# Patient Record
Sex: Female | Born: 1990 | Race: White | Hispanic: No | Marital: Single | Smoking: Current every day smoker
Health system: Southern US, Community
[De-identification: ages and names within clinical notes are randomized; demographics above are authoritative.]

## PROBLEM LIST (undated history)

## (undated) HISTORY — PX: APPENDECTOMY: SHX54

---
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
## 2012-03-14 ENCOUNTER — Encounter (HOSPITAL_BASED_OUTPATIENT_CLINIC_OR_DEPARTMENT_OTHER): Payer: Self-pay | Admitting: Family Medicine

## 2012-03-14 ENCOUNTER — Emergency Department (HOSPITAL_BASED_OUTPATIENT_CLINIC_OR_DEPARTMENT_OTHER)
Admission: EM | Admit: 2012-03-14 | Discharge: 2012-03-14 | Disposition: A | Discharge status: Home / Self Care | Payer: Medicaid Other | Attending: Emergency Medicine | Admitting: Emergency Medicine

## 2012-03-14 DIAGNOSIS — M436 Torticollis: Secondary | ICD-10-CM

## 2012-03-14 DIAGNOSIS — M542 Cervicalgia: Secondary | ICD-10-CM | POA: Insufficient documentation

## 2012-03-14 DIAGNOSIS — F17200 Nicotine dependence, unspecified, uncomplicated: Secondary | ICD-10-CM | POA: Insufficient documentation

## 2012-03-14 MED ORDER — IBUPROFEN 800 MG PO TABS: 800.0000 mg | ORAL_TABLET | Freq: Three times a day (TID) | ORAL | Status: AC

## 2012-03-14 MED ORDER — IBUPROFEN 800 MG PO TABS
800.0000 mg | ORAL_TABLET | Freq: Once | ORAL | Status: AC
  Administered 2012-03-14: 800 mg via ORAL
  Filled 2012-03-14: qty 1

## 2012-03-14 MED ORDER — DIAZEPAM 5 MG PO TABS: 5.0000 mg | ORAL_TABLET | Freq: Two times a day (BID) | ORAL | Status: AC

## 2012-03-14 MED ORDER — DIAZEPAM 5 MG PO TABS
5.0000 mg | ORAL_TABLET | Freq: Once | ORAL | Status: AC
  Administered 2012-03-14: 5 mg via ORAL
  Filled 2012-03-14: qty 1

## 2012-03-14 NOTE — ED Provider Notes (Signed)
History     CSN: [id number]  Arrival date & time 03/14/12  1110   First MD Initiated Contact with Patient 03/14/12 1209      Chief Complaint  Patient presents with  . Neck Pain    (Consider location/radiation/quality/duration/timing/severity/associated sxs/prior treatment) HPI Comments: Patient presents with left-sided neck pain for the past 3 days. Has been constant. It started after she woke up one morning. She denies any weakness, numbness or tingling. She's a pack without relief. She denies any fevers, vomiting, chest pain or shortness of breath. She has no weakness in the arm. She denies any injury. She has pain with moving her head to the left.  The history is provided by the patient.    History reviewed. No pertinent past medical history.  Past Surgical History  Procedure Date  . Appendectomy     No family history on file.  History  Substance Use Topics  . Smoking status: Current Everyday Smoker  . Smokeless tobacco: Not on file  . Alcohol Use: No    OB History    Grav Para Term Preterm Abortions TAB SAB Ect Mult Living                  Review of Systems  Constitutional: Negative for fever, activity change and appetite change.  HENT: Positive for neck pain. Negative for congestion, rhinorrhea and neck stiffness.   Eyes: Negative for visual disturbance.  Respiratory: Negative for cough and shortness of breath.   Cardiovascular: Negative for chest pain.  Gastrointestinal: Negative for nausea and abdominal pain.  Genitourinary: Negative for dysuria and hematuria.  Musculoskeletal: Negative for back pain.  Skin: Negative for rash.  Neurological: Negative for dizziness, weakness and headaches.    Allergies  Review of patient's allergies indicates no known allergies.  Home Medications   Current Outpatient Rx  Name Route Sig Dispense Refill  . ASPIRIN-ACETAMINOPHEN-CAFFEINE 250-250-65 MG PO TABS Oral Take 1 tablet by mouth every 6 (six) hours as needed.        BP 103/74  Pulse 99  Temp 97.8 F (36.6 C) (Oral)  Resp 18  Ht 5\' 1"  (1.549 m)  Wt 130 lb (58.968 kg)  BMI 24.56 kg/m2  SpO2 100%  Physical Exam  Constitutional: She is oriented to person, place, and time. She appears well-developed and well-nourished. No distress.  HENT:  Head: Normocephalic and atraumatic.  Mouth/Throat: Oropharynx is clear and moist. No oropharyngeal exudate.  Eyes: Conjunctivae and EOM are normal. Pupils are equal, round, and reactive to light.  Neck: Normal range of motion. Neck supple.       Left paraspinal muscle tenderness and spasm. No midline tenderness. Full range of motion of neck  Cardiovascular: Normal rate, regular rhythm and normal heart sounds.   No murmur heard. Pulmonary/Chest: Effort normal and breath sounds normal. No respiratory distress.  Abdominal: Soft. There is no tenderness. There is no rebound and no guarding.  Musculoskeletal: Normal range of motion. She exhibits no edema and no tenderness.  Neurological: She is alert and oriented to person, place, and time. No cranial nerve deficit.       Equal grip strength bilaterally, equal biceps and triceps push and pull. +2 radial pulses.   CN 2-12 intact. No facial droop  Skin: Skin is warm.    ED Course  Procedures (including critical care time)  Labs Reviewed - No data to display No results found.   No diagnosis found.    MDM  Lateral neck pain worse  with movement. No weakness, numbness or tingling. No fevers, no cranial nerve deficits. Consistent with musculoskeletal pain.  We'll treat with anti-inflammatories and muscle relaxers. Followup with PCP       Sydney Octave, MD 03/14/12 1234

## 2012-03-14 NOTE — ED Notes (Addendum)
Pt c/o left lateral neck pain x 3days without injury. Pt able to move neck in all directions with pain. Denies pain with palpation. Pt also c/o intermittent sharp pains in left ear.

## 2012-04-12 ENCOUNTER — Emergency Department (HOSPITAL_BASED_OUTPATIENT_CLINIC_OR_DEPARTMENT_OTHER)
Admission: EM | Admit: 2012-04-12 | Discharge: 2012-04-12 | Disposition: A | Discharge status: Home / Self Care | Payer: Self-pay | Attending: Emergency Medicine | Admitting: Emergency Medicine

## 2012-04-12 ENCOUNTER — Encounter (HOSPITAL_BASED_OUTPATIENT_CLINIC_OR_DEPARTMENT_OTHER): Payer: Self-pay | Admitting: *Deleted

## 2012-04-12 DIAGNOSIS — L03319 Cellulitis of trunk, unspecified: Secondary | ICD-10-CM | POA: Insufficient documentation

## 2012-04-12 DIAGNOSIS — L0291 Cutaneous abscess, unspecified: Secondary | ICD-10-CM

## 2012-04-12 DIAGNOSIS — L02219 Cutaneous abscess of trunk, unspecified: Secondary | ICD-10-CM | POA: Insufficient documentation

## 2012-04-12 DIAGNOSIS — F17200 Nicotine dependence, unspecified, uncomplicated: Secondary | ICD-10-CM | POA: Insufficient documentation

## 2012-04-12 MED ORDER — LIDOCAINE-EPINEPHRINE 2 %-1:100000 IJ SOLN
INTRAMUSCULAR | Status: AC
  Administered 2012-04-12: 1 mL
  Filled 2012-04-12: qty 1

## 2012-04-12 MED ORDER — ACETAMINOPHEN-CODEINE #3 300-30 MG PO TABS: 1.0000 | ORAL_TABLET | Freq: Once | ORAL | Status: DC

## 2012-04-12 MED ORDER — ACETAMINOPHEN-CODEINE #3 300-30 MG PO TABS: 1.0000 | ORAL_TABLET | Freq: Four times a day (QID) | ORAL | Status: DC | PRN

## 2012-04-12 NOTE — ED Provider Notes (Signed)
History     CSN: [id number]  Arrival date & time 04/12/12  1647   First MD Initiated Contact with Patient 04/12/12 1700      Chief Complaint  Patient presents with  . Abscess    (Consider location/radiation/quality/duration/timing/severity/associated sxs/prior treatment) HPI Comments: 21 year old female presents the emergency department complaining of an abscess in the left side of her back that she noticed 3 days ago. She states that the abscess is gotten larger over the past 3 days, and when she applied a warm compress to it after a warm shower today she was able to express some pus from it. Denies fever, chills or nausea. She does not recall being bit by anything. Denies any rashes on her body. The abscess is painful to touch.  Patient is a 21 y.o. female presenting with abscess. The history is provided by the patient and a parent.  Abscess  Pertinent negatives include no fever.    History reviewed. No pertinent past medical history.  Past Surgical History  Procedure Date  . Appendectomy     History reviewed. No pertinent family history.  History  Substance Use Topics  . Smoking status: Current Every Day Smoker -- 0.5 packs/day    Types: Cigarettes  . Smokeless tobacco: Not on file  . Alcohol Use: No    OB History    Grav Para Term Preterm Abortions TAB SAB Ect Mult Living                  Review of Systems  Constitutional: Negative for fever and chills.  Respiratory: Negative for shortness of breath.   Cardiovascular: Negative for chest pain.  Gastrointestinal: Negative for nausea.  Musculoskeletal: Negative for arthralgias.  Skin:       Positive for abscess    Allergies  Review of patient's allergies indicates no known allergies.  Home Medications   Current Outpatient Rx  Name Route Sig Dispense Refill  . ASPIRIN-ACETAMINOPHEN-CAFFEINE 250-250-65 MG PO TABS Oral Take 1 tablet by mouth every 6 (six) hours as needed.      BP 121/52  Temp 98.5 F  (36.9 C) (Oral)  Resp 16  Ht 5\' 4"  (1.626 m)  Wt 130 lb (58.968 kg)  BMI 22.31 kg/m2  SpO2 100%  Physical Exam  Nursing note and vitals reviewed. Constitutional: She is oriented to person, place, and time. She appears well-developed and well-nourished. No distress.  HENT:  Head: Normocephalic and atraumatic.  Eyes: Conjunctivae normal are normal.  Neck: Normal range of motion. Neck supple.  Cardiovascular: Normal rate, regular rhythm and normal heart sounds.   Pulmonary/Chest: Effort normal and breath sounds normal.  Musculoskeletal: Normal range of motion.  Lymphadenopathy:    She has no cervical adenopathy.  Neurological: She is alert and oriented to person, place, and time.  Skin: Skin is warm and dry. No rash noted.       1 in diameter erythematous abscess with induration present on left side of back about mid-way. Tender to palpation. No active drainage. No surrounding erythema or edema.  Psychiatric: She has a normal mood and affect. Her behavior is normal.    ED Course  Procedures (including critical care time) INCISION AND DRAINAGE Performed by: Johnnette Gourd Consent: Verbal consent obtained. Risks and benefits: risks, benefits and alternatives were discussed Type: abscess  Body area: left side of back  Anesthesia: local infiltration  Local anesthetic: lidocaine 2% with epinephrine  Anesthetic total: 4 ml  Complexity: complex Blunt dissection to break up loculations  Drainage: purulent  Drainage amount: small  Packing material: 1/4 in iodoform gauze  Patient tolerance: Patient tolerated the procedure well with no immediate complications.    Labs Reviewed - No data to display No results found.   1. Abscess       MDM  21 y/o female with abscess without cellulitis. Abscess drained and packed without any problem. Will give pain medicine. No abx needed. Aware to return in 48 hours for recheck/packing removal.        Trevor Mace,  PA-C 04/12/12 1801

## 2012-04-12 NOTE — ED Notes (Signed)
Pt c/o abscess to lower back x 3 days

## 2012-04-13 NOTE — ED Provider Notes (Signed)
Medical screening examination/treatment/procedure(s) were performed by non-physician practitioner and as supervising physician I was immediately available for consultation/collaboration.  Geoffery Lyons, MD 04/13/12 1944

## 2012-04-14 ENCOUNTER — Emergency Department (HOSPITAL_BASED_OUTPATIENT_CLINIC_OR_DEPARTMENT_OTHER)
Admission: EM | Admit: 2012-04-14 | Discharge: 2012-04-14 | Disposition: A | Discharge status: Home / Self Care | Payer: Self-pay | Attending: Emergency Medicine | Admitting: Emergency Medicine

## 2012-04-14 ENCOUNTER — Encounter (HOSPITAL_BASED_OUTPATIENT_CLINIC_OR_DEPARTMENT_OTHER): Payer: Self-pay | Admitting: Emergency Medicine

## 2012-04-14 DIAGNOSIS — L0390 Cellulitis, unspecified: Secondary | ICD-10-CM | POA: Insufficient documentation

## 2012-04-14 DIAGNOSIS — L0291 Cutaneous abscess, unspecified: Secondary | ICD-10-CM | POA: Insufficient documentation

## 2012-04-14 DIAGNOSIS — Z4800 Encounter for change or removal of nonsurgical wound dressing: Secondary | ICD-10-CM | POA: Insufficient documentation

## 2012-04-14 DIAGNOSIS — F17200 Nicotine dependence, unspecified, uncomplicated: Secondary | ICD-10-CM | POA: Insufficient documentation

## 2012-04-14 NOTE — ED Notes (Signed)
Recheck abscess

## 2012-04-15 NOTE — ED Provider Notes (Signed)
History     CSN: [id number]  Arrival date & time 04/14/12  1928   First MD Initiated Contact with Patient 04/14/12 2144      Chief Complaint  Patient presents with  . Wound Check    (Consider location/radiation/quality/duration/timing/severity/associated sxs/prior treatment) Patient is a 21 y.o. female presenting with wound check. The history is provided by the patient. No language interpreter was used.  Wound Check  She was treated in the ED 2 to 3 days ago.  Pt here for recheck of i and d and packing removal.  Pt denies any complaints  History reviewed. No pertinent past medical history.  Past Surgical History  Procedure Date  . Appendectomy     No family history on file.  History  Substance Use Topics  . Smoking status: Current Every Day Smoker -- 0.5 packs/day    Types: Cigarettes  . Smokeless tobacco: Not on file  . Alcohol Use: No    OB History    Grav Para Term Preterm Abortions TAB SAB Ect Mult Living                  Review of Systems  Skin: Positive for wound.  All other systems reviewed and are negative.    Allergies  Review of patient's allergies indicates no known allergies.  Home Medications   Current Outpatient Rx  Name Route Sig Dispense Refill  . ACETAMINOPHEN-CODEINE #3 300-30 MG PO TABS Oral Take 1-2 tablets by mouth every 6 (six) hours as needed for pain. 6 tablet 0  . ASPIRIN-ACETAMINOPHEN-CAFFEINE 250-250-65 MG PO TABS Oral Take 1 tablet by mouth every 6 (six) hours as needed.      There were no vitals taken for this visit.  Physical Exam  Nursing note and vitals reviewed. Constitutional: She is oriented to person, place, and time. She appears well-developed and well-nourished.  Musculoskeletal: She exhibits tenderness.  Neurological: She is alert and oriented to person, place, and time. She has normal reflexes.  Skin: There is erythema.       1cm incision,  Packing removed.  No active drainage  Psychiatric: She has a normal  mood and affect.    ED Course  Procedures (including critical care time)  Labs Reviewed - No data to display No results found.   1. Abscess       MDM  Pt counseled on wound care.   I advised pt to return if any problems.        Lonia Skinner [place], [place] 04/15/12 2333

## 2012-04-16 NOTE — ED Provider Notes (Signed)
Medical screening examination/treatment/procedure(s) were performed by non-physician practitioner and as supervising physician I was immediately available for consultation/collaboration.   Dione Booze, MD 04/16/12 [phone]

## 2012-10-25 ENCOUNTER — Encounter (HOSPITAL_BASED_OUTPATIENT_CLINIC_OR_DEPARTMENT_OTHER): Payer: Self-pay | Admitting: Emergency Medicine

## 2012-10-25 ENCOUNTER — Emergency Department (HOSPITAL_BASED_OUTPATIENT_CLINIC_OR_DEPARTMENT_OTHER)
Admission: EM | Admit: 2012-10-25 | Discharge: 2012-10-25 | Disposition: A | Discharge status: Home / Self Care | Payer: Self-pay | Attending: Emergency Medicine | Admitting: Emergency Medicine

## 2012-10-25 DIAGNOSIS — A5901 Trichomonal vulvovaginitis: Secondary | ICD-10-CM | POA: Insufficient documentation

## 2012-10-25 DIAGNOSIS — F17200 Nicotine dependence, unspecified, uncomplicated: Secondary | ICD-10-CM | POA: Insufficient documentation

## 2012-10-25 DIAGNOSIS — A599 Trichomoniasis, unspecified: Secondary | ICD-10-CM

## 2012-10-25 DIAGNOSIS — Z3202 Encounter for pregnancy test, result negative: Secondary | ICD-10-CM | POA: Insufficient documentation

## 2012-10-25 LAB — PREGNANCY, URINE: Preg Test, Ur: NEGATIVE

## 2012-10-25 LAB — WET PREP, GENITAL

## 2012-10-25 MED ORDER — LIDOCAINE HCL (PF) 1 % IJ SOLN
INTRAMUSCULAR | Status: AC
  Administered 2012-10-25: 5 mL
  Filled 2012-10-25: qty 5

## 2012-10-25 MED ORDER — AZITHROMYCIN 1 G PO PACK
1.0000 g | PACK | Freq: Once | ORAL | Status: AC
  Administered 2012-10-25: 1 g via ORAL
  Filled 2012-10-25: qty 1

## 2012-10-25 MED ORDER — CEFTRIAXONE SODIUM 250 MG IJ SOLR
250.0000 mg | Freq: Once | INTRAMUSCULAR | Status: AC
  Administered 2012-10-25: 250 mg via INTRAMUSCULAR
  Filled 2012-10-25: qty 250

## 2012-10-25 MED ORDER — METRONIDAZOLE 500 MG PO TABS
2000.0000 mg | ORAL_TABLET | Freq: Once | ORAL | Status: AC
  Administered 2012-10-25: 2000 mg via ORAL
  Filled 2012-10-25: qty 4

## 2012-10-25 NOTE — ED Provider Notes (Signed)
History     CSN: [id number]  Arrival date & time 10/25/12  0057   First MD Initiated Contact with Patient 10/25/12 0125      Chief Complaint  Patient presents with  . Vaginal Discharge  . Abscess    (Consider location/radiation/quality/duration/timing/severity/associated sxs/prior treatment) Patient is a 22 y.o. female presenting with vaginal discharge. The history is provided by the patient. No language interpreter was used.  Vaginal Discharge This is a recurrent problem. The current episode started more than 1 week ago. The problem occurs constantly. The problem has not changed since onset.Pertinent negatives include no chest pain, no abdominal pain and no shortness of breath. Nothing aggravates the symptoms. Nothing relieves the symptoms. She has tried nothing for the symptoms. The treatment provided no relief.  Also has raised lesion on mons pubis for 2 months  History reviewed. No pertinent past medical history.  Past Surgical History  Procedure Laterality Date  . Appendectomy      No family history on file.  History  Substance Use Topics  . Smoking status: Current Every Day Smoker -- 0.50 packs/day    Types: Cigarettes  . Smokeless tobacco: Not on file  . Alcohol Use: No    OB History   Grav Para Term Preterm Abortions TAB SAB Ect Mult Living                  Review of Systems  Respiratory: Negative for shortness of breath.   Cardiovascular: Negative for chest pain.  Gastrointestinal: Negative for abdominal pain.  Genitourinary: Positive for vaginal discharge.  All other systems reviewed and are negative.    Allergies  Review of patient's allergies indicates no known allergies.  Home Medications   Current Outpatient Rx  Name  Route  Sig  Dispense  Refill  . acetaminophen-codeine (TYLENOL #3) 300-30 MG per tablet   Oral   Take 1-2 tablets by mouth every 6 (six) hours as needed for pain.   6 tablet   0   . aspirin-acetaminophen-caffeine (EXCEDRIN  MIGRAINE) 250-250-65 MG per tablet   Oral   Take 1 tablet by mouth every 6 (six) hours as needed.           BP 137/58  Pulse 109  Temp(Src) 98.5 F (36.9 C) (Oral)  Resp 16  Ht 5\' 4"  (1.626 m)  Wt 123 lb (55.792 kg)  BMI 21.1 kg/m2  SpO2 100%  LMP 10/03/2012  Physical Exam  Constitutional: She is oriented to person, place, and time. She appears well-developed and well-nourished. No distress.  HENT:  Head: Normocephalic and atraumatic.  Mouth/Throat: Oropharynx is clear and moist.  Eyes: Conjunctivae are normal. Pupils are equal, round, and reactive to light.  Neck: Normal range of motion. Neck supple.  Cardiovascular: Normal rate, regular rhythm and intact distal pulses.   Pulmonary/Chest: Effort normal and breath sounds normal. She has no wheezes. She has no rales.  Abdominal: Soft. Bowel sounds are normal. There is no tenderness. There is no rebound and no guarding.  Genitourinary: Vaginal discharge found.  Ingrown hair with abrasion and contused skin consistent with shaving.  No warmth no erythema no fluctuance.  Ingrown hair abutts a condyloma.  No indication for I/D  Chaperone present discharge is frothy no cmt no adnexal tenderness  Musculoskeletal: Normal range of motion.  Neurological: She is alert and oriented to person, place, and time.  Skin: Skin is warm and dry.  Psychiatric: She has a normal mood and affect.    ED  Course  Procedures (including critical care time)  Labs Reviewed  WET PREP, GENITAL  GC/CHLAMYDIA PROBE AMP  PREGNANCY, URINE   No results found.   No diagnosis found.    MDM  Exam and wet prep consistent with trichomonas will treat for all STI.  No sexaul activity of any kind until 7 days after all partners treated [facility] for recheck in 7 days.  Patient verbalizes understanding and agrees to follow up.  Neosporin on abraded hair/condyloma.  No shaving x 2 weeks        Charls Custer K Arinze Rivadeneira-Rasch,  MD 10/25/12 [phone]

## 2012-10-25 NOTE — ED Notes (Signed)
Abscess in "bikini line" x2 months.  Sts she can usually get them to "come to a head" herself but this one is getting worse. Also has heavy white vaginal DC x2 weeks.  Was seen at the health dept in [facility] and was told they would call if any tests came back pos.  No one called but the DC continues.  Sts she has called them multiple times but has not been able to get any results.

## 2012-12-06 ENCOUNTER — Emergency Department (HOSPITAL_BASED_OUTPATIENT_CLINIC_OR_DEPARTMENT_OTHER)
Admission: EM | Admit: 2012-12-06 | Discharge: 2012-12-06 | Disposition: A | Discharge status: Home / Self Care | Payer: Self-pay | Attending: Emergency Medicine | Admitting: Emergency Medicine

## 2012-12-06 ENCOUNTER — Emergency Department (HOSPITAL_BASED_OUTPATIENT_CLINIC_OR_DEPARTMENT_OTHER): Payer: Self-pay

## 2012-12-06 ENCOUNTER — Encounter (HOSPITAL_BASED_OUTPATIENT_CLINIC_OR_DEPARTMENT_OTHER): Payer: Self-pay | Admitting: Family Medicine

## 2012-12-06 DIAGNOSIS — B9689 Other specified bacterial agents as the cause of diseases classified elsewhere: Secondary | ICD-10-CM

## 2012-12-06 DIAGNOSIS — R109 Unspecified abdominal pain: Secondary | ICD-10-CM | POA: Insufficient documentation

## 2012-12-06 DIAGNOSIS — Z9089 Acquired absence of other organs: Secondary | ICD-10-CM | POA: Insufficient documentation

## 2012-12-06 DIAGNOSIS — Z3201 Encounter for pregnancy test, result positive: Secondary | ICD-10-CM | POA: Insufficient documentation

## 2012-12-06 DIAGNOSIS — N760 Acute vaginitis: Secondary | ICD-10-CM | POA: Insufficient documentation

## 2012-12-06 DIAGNOSIS — Z3490 Encounter for supervision of normal pregnancy, unspecified, unspecified trimester: Secondary | ICD-10-CM

## 2012-12-06 DIAGNOSIS — F17200 Nicotine dependence, unspecified, uncomplicated: Secondary | ICD-10-CM | POA: Insufficient documentation

## 2012-12-06 LAB — BASIC METABOLIC PANEL
Calcium: 9.5 mg/dL (ref 8.4–10.5)
Creatinine, Ser: 0.7 mg/dL (ref 0.50–1.10)
GFR calc Af Amer: >90 mL/min (ref >90)

## 2012-12-06 LAB — URINALYSIS, ROUTINE W REFLEX MICROSCOPIC
Glucose, UA: NEGATIVE mg/dL
Ketones, ur: NEGATIVE mg/dL
Leukocytes, UA: NEGATIVE
pH: 6 (ref 5.0–8.0)

## 2012-12-06 LAB — CBC WITH DIFFERENTIAL/PLATELET
Basophils Absolute: 0 10*3/uL (ref 0.0–0.1)
Basophils Relative: 0 % (ref 0–1)
Eosinophils Relative: 2 % (ref 0–5)
HCT: 38.6 % (ref 36.0–46.0)
MCH: 33.1 pg (ref 26.0–34.0)
MCHC: 34.7 g/dL (ref 30.0–36.0)
MCV: 95.3 fL (ref 78.0–100.0)
Monocytes Absolute: 0.6 10*3/uL (ref 0.1–1.0)
RDW: 13.1 % (ref 11.5–15.5)

## 2012-12-06 LAB — PREGNANCY, URINE: Preg Test, Ur: POSITIVE — AB

## 2012-12-06 LAB — WET PREP, GENITAL: Trich, Wet Prep: NONE SEEN

## 2012-12-06 LAB — HCG, QUANTITATIVE, PREGNANCY: hCG, Beta Chain, Quant, S: 772 m[IU]/mL — ABNORMAL HIGH (ref <5)

## 2012-12-06 MED ORDER — PRENATAL COMPLETE 14-0.4 MG PO TABS: 1.0000 | ORAL_TABLET | Freq: Every day | ORAL | Status: DC

## 2012-12-06 MED ORDER — METRONIDAZOLE 500 MG PO TABS: 500.0000 mg | ORAL_TABLET | Freq: Two times a day (BID) | ORAL | Status: DC

## 2012-12-06 NOTE — ED Provider Notes (Signed)
History     CSN: [id number]  Arrival date & time 12/06/12  1725   First MD Initiated Contact with Patient 12/06/12 1759      Chief Complaint  Patient presents with  . Vaginal Discharge  . Possible Pregnancy    (Consider location/radiation/quality/duration/timing/severity/associated sxs/prior treatment) Patient is a 22 y.o. female presenting with vaginal discharge and pregnancy problem.  Vaginal Discharge Possible Pregnancy  Primary symptoms include vaginal discharge.   Pt reports she was treated for trichomonas about 6 weeks ago, reports she abstained from sex until about 2 weeks ago when she had sex using a condom, however the condom broke. She has had discharge again since then, white thick with odor. She also reports she had a severe, sharp suprapubic pain last night which lasted for several minutes and then resolved. No current pain. No vaginal bleeding. She was previous on Depo-Provera and so is unsure what her menstrual cycle regularity is or when she last had one. No previous pregnancies.   History reviewed. No pertinent past medical history.  Past Surgical History  Procedure Laterality Date  . Appendectomy      No family history on file.  History  Substance Use Topics  . Smoking status: Current Every Day Smoker -- 0.50 packs/day    Types: Cigarettes  . Smokeless tobacco: Not on file  . Alcohol Use: Yes    OB History   Grav Para Term Preterm Abortions TAB SAB Ect Mult Living                  Review of Systems  Genitourinary: Positive for vaginal discharge.   All other systems reviewed and are negative except as noted in HPI.   Allergies  Review of patient's allergies indicates no known allergies.  Home Medications   Current Outpatient Rx  Name  Route  Sig  Dispense  Refill  . acetaminophen-codeine (TYLENOL #3) 300-30 MG per tablet   Oral   Take 1-2 tablets by mouth every 6 (six) hours as needed for pain.   6 tablet   0   .  aspirin-acetaminophen-caffeine (EXCEDRIN MIGRAINE) 250-250-65 MG per tablet   Oral   Take 1 tablet by mouth every 6 (six) hours as needed.           BP 106/85  Pulse 108  Temp(Src) 99.2 F (37.3 C) (Oral)  Resp 16  Ht 5\' 4"  (1.626 m)  Wt 130 lb (58.968 kg)  BMI 22.3 kg/m2  SpO2 99%  Physical Exam  Nursing note and vitals reviewed. Constitutional: She is oriented to person, place, and time. She appears well-developed and well-nourished.  HENT:  Head: Normocephalic and atraumatic.  Eyes: EOM are normal. Pupils are equal, round, and reactive to light.  Neck: Normal range of motion. Neck supple.  Cardiovascular: Normal rate, normal heart sounds and intact distal pulses.   Pulmonary/Chest: Effort normal and breath sounds normal.  Abdominal: Bowel sounds are normal. She exhibits no distension. There is no tenderness.  Genitourinary: Uterus is not tender. Cervix exhibits discharge. Cervix exhibits no motion tenderness and no friability. Right adnexum displays no mass and no tenderness. Left adnexum displays tenderness. Left adnexum displays no mass. No bleeding around the vagina. No foreign body around the vagina. Vaginal discharge found.  Musculoskeletal: Normal range of motion. She exhibits no edema and no tenderness.  Neurological: She is alert and oriented to person, place, and time. She has normal strength. No cranial nerve deficit or sensory deficit.  Skin: Skin is warm  and dry. No rash noted.  Psychiatric: She has a normal mood and affect.    ED Course  Procedures (including critical care time)  Labs Reviewed  WET PREP, GENITAL - Abnormal; Notable for the following:    Clue Cells Wet Prep HPF POC MANY (*)    WBC, Wet Prep HPF POC FEW (*)    All other components within normal limits  PREGNANCY, URINE - Abnormal; Notable for the following:    Preg Test, Ur POSITIVE (*)    All other components within normal limits  BASIC METABOLIC PANEL - Abnormal; Notable for the  following:    Glucose, Bld 123 (*)    All other components within normal limits  HCG, QUANTITATIVE, PREGNANCY - Abnormal; Notable for the following:    hCG, Beta Chain, Quant, S 772 (*)    All other components within normal limits  GC/CHLAMYDIA PROBE AMP  URINALYSIS, ROUTINE W REFLEX MICROSCOPIC  CBC WITH DIFFERENTIAL   US Ob Comp Less 14 Wks  12/06/2012   *RADIOLOGY REPORT*  Clinical Data: Pelvic pain.  Early pregnancy.  OBSTETRIC <14 WK [place] AND TRANSVAGINAL OB US  Technique: Both transabdominal and transvaginal ultrasound examinations were performed for complete evaluation of the gestation as well as the maternal uterus, adnexal regions, and pelvic cul-de-sac.  Findings: Single intrauterine gestational sac observed with yolk sac and embryo.  Embryonic cardiac activity is visible 155 beats per minute.  Crown-rump length 2.3 mm compatible with 5 weeks 5 days gestation.  No subchorionic hemorrhage observed.  Maternal right ovary measures 3.2 x 1.8 x 2.1 cm.  The left ovary measures 2.3 x 1.5 x 1.8 cm  No free pelvic fluid.  IMPRESSION:  1.  Single intrauterine pregnancy measuring at 5 weeks 5 days gestation, with cardiac activity 155 beats per minute.  No free fluid or adnexal mass observed.   Original Report Authenticated By: Gaylyn Rong, M.D.   US Ob Transvaginal  12/06/2012   *RADIOLOGY REPORT*  Clinical Data: Pelvic pain.  Early pregnancy.  OBSTETRIC <14 WK [place] AND TRANSVAGINAL OB US  Technique: Both transabdominal and transvaginal ultrasound examinations were performed for complete evaluation of the gestation as well as the maternal uterus, adnexal regions, and pelvic cul-de-sac.  Findings: Single intrauterine gestational sac observed with yolk sac and embryo.  Embryonic cardiac activity is visible 155 beats per minute.  Crown-rump length 2.3 mm compatible with 5 weeks 5 days gestation.  No subchorionic hemorrhage observed.  Maternal right ovary measures 3.2 x 1.8 x 2.1 cm.  The left ovary  measures 2.3 x 1.5 x 1.8 cm  No free pelvic fluid.  IMPRESSION:  1.  Single intrauterine pregnancy measuring at 5 weeks 5 days gestation, with cardiac activity 155 beats per minute.  No free fluid or adnexal mass observed.   Original Report Authenticated By: Gaylyn Rong, M.D.     1. Normal IUP (intrauterine pregnancy) on prenatal ultrasound   2. BV (bacterial vaginosis)       MDM  Wet prep shows BV, trich has resolved. [place] neg for ectopic, viable IUP seen. Advised Ob followup. Prenatal vitamins. Return for any other concerns.         Anees Vanecek B. Bernette Mayers, MD 12/06/12 2018

## 2012-12-06 NOTE — ED Notes (Signed)
MD at bedside. 

## 2012-12-06 NOTE — ED Notes (Signed)
Pt sts she would like a pregnancy test. Pt sts she had one episode of lower abd pain last night. Pt also reports recently treated for std and vag discharge has returned.

## 2012-12-10 ENCOUNTER — Encounter (HOSPITAL_BASED_OUTPATIENT_CLINIC_OR_DEPARTMENT_OTHER): Payer: Self-pay | Admitting: Student

## 2012-12-10 ENCOUNTER — Emergency Department (HOSPITAL_BASED_OUTPATIENT_CLINIC_OR_DEPARTMENT_OTHER)
Admission: EM | Admit: 2012-12-10 | Discharge: 2012-12-10 | Disposition: A | Discharge status: Home / Self Care | Payer: Self-pay | Attending: Emergency Medicine | Admitting: Emergency Medicine

## 2012-12-10 DIAGNOSIS — F17200 Nicotine dependence, unspecified, uncomplicated: Secondary | ICD-10-CM | POA: Insufficient documentation

## 2012-12-10 DIAGNOSIS — Z79899 Other long term (current) drug therapy: Secondary | ICD-10-CM | POA: Insufficient documentation

## 2012-12-10 DIAGNOSIS — O039 Complete or unspecified spontaneous abortion without complication: Secondary | ICD-10-CM | POA: Insufficient documentation

## 2012-12-10 NOTE — ED Notes (Signed)
Pt in with continued abdominal pain and reports passing large clots since last night. Reports being [redacted] week pregnant

## 2012-12-10 NOTE — ED Notes (Signed)
Pelvic cart at bedside, supplies gathered for pelvic exam.

## 2012-12-10 NOTE — ED Provider Notes (Signed)
History     CSN: [id number]  Arrival date & time 12/10/12  1353   First MD Initiated Contact with Patient 12/10/12 1357      Chief Complaint  Patient presents with  . Vaginal Bleeding    large clots since last night, [redacted] weeks pregnant    (Consider location/radiation/quality/duration/timing/severity/associated sxs/prior treatment) Patient is a 22 y.o. female presenting with vaginal bleeding.  Vaginal Bleeding  Pt known to me from recent visit for pelvic pain and vaginal discharge found to be approx [redacted]wks pregnant then with BV reports light vaginal bleeding onset 2 days ago and passing a large clot last night. She has had some mild cramping.    History reviewed. No pertinent past medical history.  Past Surgical History  Procedure Laterality Date  . Appendectomy      History reviewed. No pertinent family history.  History  Substance Use Topics  . Smoking status: Current Every Day Smoker -- 0.50 packs/day    Types: Cigarettes  . Smokeless tobacco: Not on file  . Alcohol Use: Yes    OB History   Grav Para Term Preterm Abortions TAB SAB Ect Mult Living                  Review of Systems  Genitourinary: Positive for vaginal bleeding.   All other systems reviewed and are negative except as noted in HPI.   Allergies  Review of patient's allergies indicates no known allergies.  Home Medications   Current Outpatient Rx  Name  Route  Sig  Dispense  Refill  . acetaminophen-codeine (TYLENOL #3) 300-30 MG per tablet   Oral   Take 1-2 tablets by mouth every 6 (six) hours as needed for pain.   6 tablet   0   . aspirin-acetaminophen-caffeine (EXCEDRIN MIGRAINE) 250-250-65 MG per tablet   Oral   Take 1 tablet by mouth every 6 (six) hours as needed.         . metroNIDAZOLE (FLAGYL) 500 MG tablet   Oral   Take 1 tablet (500 mg total) by mouth 2 (two) times daily.   14 tablet   0   . Prenatal Vit-Fe Fumarate-FA (PRENATAL COMPLETE) 14-0.4 MG TABS   Oral   Take 1  tablet by mouth daily.   30 each   0     BP 124/85  Pulse 102  Temp(Src) 98.8 F (37.1 C) (Oral)  Resp 16  Wt 130 lb (58.968 kg)  BMI 22.3 kg/m2  SpO2 100%  LMP 11/04/2012  Physical Exam  Nursing note and vitals reviewed. Constitutional: She is oriented to person, place, and time. She appears well-developed and well-nourished.  HENT:  Head: Normocephalic and atraumatic.  Eyes: EOM are normal. Pupils are equal, round, and reactive to light.  Neck: Normal range of motion. Neck supple.  Cardiovascular: Normal rate, normal heart sounds and intact distal pulses.   Pulmonary/Chest: Effort normal and breath sounds normal.  Abdominal: Bowel sounds are normal. She exhibits no distension. There is no tenderness.  Genitourinary:  Blood in vagina, no POC at cervical os  Musculoskeletal: Normal range of motion. She exhibits no edema and no tenderness.  Neurological: She is alert and oriented to person, place, and time. She has normal strength. No cranial nerve deficit or sensory deficit.  Skin: Skin is warm and dry. No rash noted.  Psychiatric: She has a normal mood and affect.    ED Course  Procedures (including critical care time)  Labs Reviewed - No data to  display No results found.   1. Spontaneous miscarriage       MDM  Pt with likely spontaneous miscarriage. No POC seen. Advised Gyn followup if bleeding is persistent or pain increases.         Dyanna Seiter B. Bernette Mayers, MD 12/10/12 1443

## 2013-02-03 ENCOUNTER — Emergency Department (HOSPITAL_BASED_OUTPATIENT_CLINIC_OR_DEPARTMENT_OTHER)
Admission: EM | Admit: 2013-02-03 | Discharge: 2013-02-04 | Disposition: A | Discharge status: Home / Self Care | Payer: Self-pay | Attending: Emergency Medicine | Admitting: Emergency Medicine

## 2013-02-03 ENCOUNTER — Encounter (HOSPITAL_BASED_OUTPATIENT_CLINIC_OR_DEPARTMENT_OTHER): Payer: Self-pay | Admitting: Emergency Medicine

## 2013-02-03 ENCOUNTER — Emergency Department (HOSPITAL_BASED_OUTPATIENT_CLINIC_OR_DEPARTMENT_OTHER): Payer: Self-pay

## 2013-02-03 DIAGNOSIS — Z3202 Encounter for pregnancy test, result negative: Secondary | ICD-10-CM | POA: Insufficient documentation

## 2013-02-03 DIAGNOSIS — R51 Headache: Secondary | ICD-10-CM | POA: Insufficient documentation

## 2013-02-03 DIAGNOSIS — F17200 Nicotine dependence, unspecified, uncomplicated: Secondary | ICD-10-CM | POA: Insufficient documentation

## 2013-02-03 DIAGNOSIS — Z79899 Other long term (current) drug therapy: Secondary | ICD-10-CM | POA: Insufficient documentation

## 2013-02-03 DIAGNOSIS — L03211 Cellulitis of face: Secondary | ICD-10-CM | POA: Insufficient documentation

## 2013-02-03 DIAGNOSIS — L0201 Cutaneous abscess of face: Secondary | ICD-10-CM | POA: Insufficient documentation

## 2013-02-03 DIAGNOSIS — R21 Rash and other nonspecific skin eruption: Secondary | ICD-10-CM | POA: Insufficient documentation

## 2013-02-03 LAB — CBC WITH DIFFERENTIAL/PLATELET
Basophils Absolute: 0 10*3/uL (ref 0.0–0.1)
Eosinophils Absolute: 0.1 10*3/uL (ref 0.0–0.7)
HCT: 43.2 % (ref 36.0–46.0)
Lymphocytes Relative: 25 % (ref 12–46)
MCH: 33 pg (ref 26.0–34.0)
MCHC: 34 g/dL (ref 30.0–36.0)
Monocytes Absolute: 0.6 10*3/uL (ref 0.1–1.0)
Neutro Abs: 4.5 10*3/uL (ref 1.7–7.7)
RDW: 12.9 % (ref 11.5–15.5)

## 2013-02-03 LAB — BASIC METABOLIC PANEL
BUN: 12 mg/dL (ref 6–23)
Calcium: 9.9 mg/dL (ref 8.4–10.5)
Creatinine, Ser: 0.8 mg/dL (ref 0.50–1.10)
GFR calc Af Amer: >90 mL/min (ref >90)
GFR calc non Af Amer: >90 mL/min (ref >90)
Potassium: 3.5 mEq/L (ref 3.5–5.1)

## 2013-02-03 MED ORDER — MORPHINE SULFATE 4 MG/ML IJ SOLN
4.0000 mg | Freq: Once | INTRAMUSCULAR | Status: AC
  Administered 2013-02-03: 4 mg via INTRAVENOUS
  Filled 2013-02-03: qty 1

## 2013-02-03 MED ORDER — CLINDAMYCIN PHOSPHATE 600 MG/50ML IV SOLN
INTRAVENOUS | Status: AC
  Administered 2013-02-03: 600 mg via INTRAVENOUS
  Filled 2013-02-03: qty 50

## 2013-02-03 MED ORDER — SODIUM CHLORIDE 0.9 % IV SOLN
Freq: Once | INTRAVENOUS | Status: AC
  Administered 2013-02-03: 22:00:00 via INTRAVENOUS

## 2013-02-03 MED ORDER — CLINDAMYCIN PHOSPHATE 600 MG/50ML IV SOLN
600.0000 mg | Freq: Once | INTRAVENOUS | Status: AC
  Administered 2013-02-03: 600 mg via INTRAVENOUS

## 2013-02-03 MED ORDER — CLINDAMYCIN HCL 150 MG PO CAPS: 150.0000 mg | ORAL_CAPSULE | Freq: Four times a day (QID) | ORAL | Status: DC

## 2013-02-03 MED ORDER — IOHEXOL 300 MG/ML  SOLN
75.0000 mL | Freq: Once | INTRAMUSCULAR | Status: AC | PRN
  Administered 2013-02-03: 75 mL via INTRAVENOUS

## 2013-02-03 MED ORDER — MORPHINE SULFATE 2 MG/ML IJ SOLN
INTRAMUSCULAR | Status: AC
  Administered 2013-02-03: 6 mg via INTRAVENOUS
  Filled 2013-02-03: qty 1

## 2013-02-03 MED ORDER — ONDANSETRON HCL 4 MG/2ML IJ SOLN
4.0000 mg | Freq: Once | INTRAMUSCULAR | Status: AC
  Administered 2013-02-03: 4 mg via INTRAVENOUS
  Filled 2013-02-03: qty 2

## 2013-02-03 MED ORDER — MORPHINE SULFATE 4 MG/ML IJ SOLN
6.0000 mg | Freq: Once | INTRAMUSCULAR | Status: DC
  Filled 2013-02-03: qty 1

## 2013-02-03 MED ORDER — HYDROCODONE-ACETAMINOPHEN 5-325 MG PO TABS: 2.0000 | ORAL_TABLET | ORAL | Status: DC | PRN

## 2013-02-03 NOTE — ED Notes (Signed)
Pt sts boil to lateral nose, with erythema and swelling extending to above left eye and across left cheek. Pt also reports lump below left jaw/throat. Pt has second abcess to R AC area. Pt has hx of boils to back that needed I&D.

## 2013-02-03 NOTE — ED Provider Notes (Signed)
Medical screening examination/treatment/procedure(s) were performed by non-physician practitioner and as supervising physician I was immediately available for consultation/collaboration.   Rolan Bucco, MD 02/03/13 2340

## 2013-02-03 NOTE — ED Provider Notes (Signed)
CSN: [id number]     Arrival date & time 02/03/13  2043 History     First MD Initiated Contact with Patient 02/03/13 2122     Chief Complaint  Patient presents with  . Recurrent Skin Infections   (Consider location/radiation/quality/duration/timing/severity/associated sxs/prior Treatment) HPI  22 year old female presents for evaluation of skin abscess. Patient reports she noticed a pimple to the left side of the nose about 4 days ago. She did not "mess with it" however for the past 3 days she has noticed significant redness, swelling and pain around her left face from the pimple which extended towards her left eye. States her face is tight, and very tender. Patient denies fever, neck stiffness, chest pain, shortness of breath, vision changes, or pain with eye movement.She has had prior history of abscess in the past requiring I&D. Patient is a smoker.   No past medical history on file. Past Surgical History  Procedure Laterality Date  . Appendectomy     No family history on file. History  Substance Use Topics  . Smoking status: Current Every Day Smoker -- 0.50 packs/day    Types: Cigarettes  . Smokeless tobacco: Not on file  . Alcohol Use: Yes   OB History   Grav Para Term Preterm Abortions TAB SAB Ect Mult Living                 Review of Systems  Constitutional: Negative for fever.  HENT: Positive for facial swelling.   Skin: Positive for rash.  Neurological: Positive for headaches.  All other systems reviewed and are negative.    Allergies  Review of patient's allergies indicates no known allergies.  Home Medications   Current Outpatient Rx  Name  Route  Sig  Dispense  Refill  . acetaminophen-codeine (TYLENOL #3) 300-30 MG per tablet   Oral   Take 1-2 tablets by mouth every 6 (six) hours as needed for pain.   6 tablet   0   . aspirin-acetaminophen-caffeine (EXCEDRIN MIGRAINE) 250-250-65 MG per tablet   Oral   Take 1 tablet by mouth every 6 (six) hours as  needed.         . metroNIDAZOLE (FLAGYL) 500 MG tablet   Oral   Take 1 tablet (500 mg total) by mouth 2 (two) times daily.   14 tablet   0   . Prenatal Vit-Fe Fumarate-FA (PRENATAL COMPLETE) 14-0.4 MG TABS   Oral   Take 1 tablet by mouth daily.   30 each   0    BP 121/72  Pulse 84  Temp(Src) 98.7 F (37.1 C) (Oral)  Resp 18  Ht 5\' 4"  (1.626 m)  Wt 130 lb (58.968 kg)  BMI 22.3 kg/m2  SpO2 100%  LMP 01/27/2013 Physical Exam  Nursing note and vitals reviewed. Constitutional: She is oriented to person, place, and time. She appears well-developed and well-nourished. No distress.  HENT:  Head: Normocephalic.  A maculopapular lesion noted lateral to left nares along with moderate erythema, edema noted to left side of face extending to left preorbital region. Area of nonindurated with out obvious fluctuance lateral left side of nose. Moderate tenderness to palpation.  Eyes: Conjunctivae and EOM are normal. Pupils are equal, round, and reactive to light.  Left eye is teary  Neck: Normal range of motion. Neck supple.  Cardiovascular: Normal rate and regular rhythm.   Pulmonary/Chest: Effort normal and breath sounds normal.  Lymphadenopathy:    She has no cervical adenopathy.  Neurological: She is alert  and oriented to person, place, and time.    ED Course   Procedures (including critical care time)  9:35 PM Patient with facial abscess/cellulitis. Will obtain maxillofacial to evaluate the extensiveness affect. Will give patient clindamycin via IV with pain medication. Workup initiated. Patient is currently afebrile, vital signs stable, nontoxic appearance.  11:28 PM Maxillofacial CT shows evidence of soft tissue swelling to the left maxillary states that she consistence with cellulitis. There is a central poorly defined subcutaneous abscess were noted. Pt has normal WBC.  Pain well controlled.  Plan to discharge pt with clindamycin and pain medication.  Also give referral to  ENT for close follow up.  Pt made aware to return in 2 days if unable to f/u with ENT for reevaluation.  Return sooner if sxs worsen.  Care discussed with attending.  Labs Reviewed  CBC WITH DIFFERENTIAL - Abnormal; Notable for the following:    Platelets 117 (*)    All other components within normal limits  BASIC METABOLIC PANEL - Abnormal; Notable for the following:    Glucose, Bld 109 (*)    All other components within normal limits  PREGNANCY, URINE   Ct Maxillofacial W/cm  02/03/2013   *RADIOLOGY REPORT*  Clinical Data: Boil to lateral aspect of the nose with associated erythema and swelling extending above the left eye and across the left cheek, lump below the left jaw  CT MAXILLOFACIAL WITH CONTRAST  Technique:  Multidetector CT imaging of the maxillofacial structures was performed with intravenous contrast. Multiplanar CT image reconstructions were also generated.  Contrast: 75mL OMNIPAQUE IOHEXOL 300 MG/ML  SOLN  Comparison: None.  Findings:  There is asymmetric soft tissue swelling about the medial aspect of the left maxilla extending to involve the base of the nose.  This finding is associated with mild heterogeneous enhancement of the soft tissues with an ill defined approximately 1.1 x 0.8 cm area of central nonenhancement which may represent a developing poorly defined subcutaneous abscess. Asymmetric soft tissue swelling extends to involve the majority of the left cheek.  No additional discrete areas of asymmetric soft tissue swelling are identified.  No discrete areas of osteolysis to suggest osteomyelitis.  No radiopaque foreign body.  Normal appearance of the bilateral globes and orbits.  No displaced facial fracture.  Normal appearance of the bilateral pterygoid plates.  Normal appearance of the bilateral zygomatic arches.  Normal appearance of the mandible.  Normal appearance of the paranasal sinuses and mastoid air cells. No air fluid levels.  There is mild leftward nasal septal  deviation.  Normal appearance of the imaged superior aspect of the cervical spine.  The imaged intracranial structures appear normal.  IMPRESSION: Soft tissue swelling about the medial aspect of the left maxilla extending to involve the majority of the left cheek and base of the left side of the nose.  While the affected soft tissues demonstrate diffuse mottled enhancement, there is a central approximately 1.1 cm area of non enhancement adjacent to the base of the left side of the nose which may represent a developing poorly defined subcutaneous abscess.  Clinical correlation is recommended.   Original Report Authenticated By: Tacey Ruiz, MD   1. Abscess or cellulitis of face     MDM  BP 121/72  Pulse 84  Temp(Src) 98.7 F (37.1 C) (Oral)  Resp 18  Ht 5\' 4"  (1.626 m)  Wt 130 lb (58.968 kg)  BMI 22.3 kg/m2  SpO2 100%  LMP 01/27/2013  I have reviewed nursing  notes and vital signs. I personally reviewed the imaging tests through PACS system  I reviewed available ER/hospitalization records thought the EMR   Fayrene Helper, [place] 02/03/13 2335

## 2013-02-05 ENCOUNTER — Encounter (HOSPITAL_BASED_OUTPATIENT_CLINIC_OR_DEPARTMENT_OTHER): Payer: Self-pay | Admitting: *Deleted

## 2013-02-05 ENCOUNTER — Emergency Department (HOSPITAL_BASED_OUTPATIENT_CLINIC_OR_DEPARTMENT_OTHER)
Admission: EM | Admit: 2013-02-05 | Discharge: 2013-02-05 | Disposition: A | Discharge status: Home / Self Care | Payer: Self-pay | Attending: Emergency Medicine | Admitting: Emergency Medicine

## 2013-02-05 DIAGNOSIS — F17200 Nicotine dependence, unspecified, uncomplicated: Secondary | ICD-10-CM | POA: Insufficient documentation

## 2013-02-05 DIAGNOSIS — L03211 Cellulitis of face: Secondary | ICD-10-CM | POA: Insufficient documentation

## 2013-02-05 DIAGNOSIS — L0201 Cutaneous abscess of face: Secondary | ICD-10-CM | POA: Insufficient documentation

## 2013-02-05 DIAGNOSIS — Z79899 Other long term (current) drug therapy: Secondary | ICD-10-CM | POA: Insufficient documentation

## 2013-02-05 MED ORDER — IBUPROFEN 800 MG PO TABS
800.0000 mg | ORAL_TABLET | Freq: Once | ORAL | Status: AC
  Administered 2013-02-05: 800 mg via ORAL
  Filled 2013-02-05: qty 1

## 2013-02-05 MED ORDER — MUPIROCIN 2 % EX OINT: TOPICAL_OINTMENT | Freq: Three times a day (TID) | CUTANEOUS | Status: DC

## 2013-02-05 MED ORDER — IBUPROFEN 800 MG PO TABS: 800.0000 mg | ORAL_TABLET | Freq: Three times a day (TID) | ORAL | Status: DC

## 2013-02-05 NOTE — ED Notes (Signed)
Pt was seen here 2 days ago for a facial abscess. Tonight the abscess began draining and pt sts she was able to get some drainage out of it but cannot get it all.

## 2013-02-05 NOTE — ED Notes (Signed)
rx x 2 given for ibuprofen and bactroban

## 2013-02-05 NOTE — ED Provider Notes (Signed)
CSN: [id number]     Arrival date & time 02/05/13  0355 History     First MD Initiated Contact with Patient 02/05/13 0405     Chief Complaint  Patient presents with  . Abscess   (Consider location/radiation/quality/duration/timing/severity/associated sxs/prior Treatment) Patient is a 22 y.o. female presenting with abscess. The history is provided by the patient.  Abscess Location:  Face Facial abscess location: corner of the left nare. Size:  7mm Abscess quality: draining, induration and painful   Red streaking: no   Progression:  Improving Pain details:    Quality:  Throbbing   Severity:  Mild   Timing:  Constant   Progression:  Unchanged Chronicity:  Recurrent Context: not diabetes   Relieved by:  Nothing Ineffective treatments:  None tried Associated symptoms: no fever   Risk factors: prior abscess     History reviewed. No pertinent past medical history. Past Surgical History  Procedure Laterality Date  . Appendectomy     No family history on file. History  Substance Use Topics  . Smoking status: Current Every Day Smoker -- 0.50 packs/day    Types: Cigarettes  . Smokeless tobacco: Not on file  . Alcohol Use: Yes   OB History   Grav Para Term Preterm Abortions TAB SAB Ect Mult Living                 Review of Systems  Constitutional: Negative for fever.  All other systems reviewed and are negative.    Allergies  Review of patient's allergies indicates no known allergies.  Home Medications   Current Outpatient Rx  Name  Route  Sig  Dispense  Refill  . acetaminophen-codeine (TYLENOL #3) 300-30 MG per tablet   Oral   Take 1-2 tablets by mouth every 6 (six) hours as needed for pain.   6 tablet   0   . aspirin-acetaminophen-caffeine (EXCEDRIN MIGRAINE) 250-250-65 MG per tablet   Oral   Take 1 tablet by mouth every 6 (six) hours as needed.         . clindamycin (CLEOCIN) 150 MG capsule   Oral   Take 1 capsule (150 mg total) by mouth every 6  (six) hours.   28 capsule   0   . HYDROcodone-acetaminophen (NORCO/VICODIN) 5-325 MG per tablet   Oral   Take 2 tablets by mouth every 4 (four) hours as needed for pain.   10 tablet   0   . ibuprofen (ADVIL,MOTRIN) 800 MG tablet   Oral   Take 1 tablet (800 mg total) by mouth 3 (three) times daily.   21 tablet   0   . metroNIDAZOLE (FLAGYL) 500 MG tablet   Oral   Take 1 tablet (500 mg total) by mouth 2 (two) times daily.   14 tablet   0   . mupirocin ointment (BACTROBAN) 2 %   Topical   Apply topically 3 (three) times daily.   22 g   0   . Prenatal Vit-Fe Fumarate-FA (PRENATAL COMPLETE) 14-0.4 MG TABS   Oral   Take 1 tablet by mouth daily.   30 each   0    BP 104/61  Pulse 72  Temp(Src) 98.2 F (36.8 C) (Oral)  Resp 18  Ht 5\' 4"  (1.626 m)  Wt 130 lb (58.968 kg)  BMI 22.3 kg/m2  SpO2 100%  LMP 01/27/2013 Physical Exam  Constitutional: She is oriented to person, place, and time. She appears well-developed and well-nourished. No distress.  HENT:  Head:  Normocephalic and atraumatic.    Mouth/Throat: Oropharynx is clear and moist.  Eyes: EOM are normal. Pupils are equal, round, and reactive to light.  Neck: Normal range of motion. Neck supple.  Cardiovascular: Normal rate, regular rhythm and intact distal pulses.   Pulmonary/Chest: Effort normal and breath sounds normal. She has no wheezes. She has no rales.  Abdominal: Soft. Bowel sounds are normal. There is no tenderness. There is no rebound and no guarding.  Musculoskeletal: Normal range of motion.  Neurological: She is alert and oriented to person, place, and time.  Skin: Skin is warm and dry. There is erythema.  Psychiatric: She has a normal mood and affect.    ED Course   Procedures (including critical care time)  Labs Reviewed - No data to display Ct Maxillofacial W/cm  02/03/2013   *RADIOLOGY REPORT*  Clinical Data: Boil to lateral aspect of the nose with associated erythema and swelling  extending above the left eye and across the left cheek, lump below the left jaw  CT MAXILLOFACIAL WITH CONTRAST  Technique:  Multidetector CT imaging of the maxillofacial structures was performed with intravenous contrast. Multiplanar CT image reconstructions were also generated.  Contrast: 75mL OMNIPAQUE IOHEXOL 300 MG/ML  SOLN  Comparison: None.  Findings:  There is asymmetric soft tissue swelling about the medial aspect of the left maxilla extending to involve the base of the nose.  This finding is associated with mild heterogeneous enhancement of the soft tissues with an ill defined approximately 1.1 x 0.8 cm area of central nonenhancement which may represent a developing poorly defined subcutaneous abscess. Asymmetric soft tissue swelling extends to involve the majority of the left cheek.  No additional discrete areas of asymmetric soft tissue swelling are identified.  No discrete areas of osteolysis to suggest osteomyelitis.  No radiopaque foreign body.  Normal appearance of the bilateral globes and orbits.  No displaced facial fracture.  Normal appearance of the bilateral pterygoid plates.  Normal appearance of the bilateral zygomatic arches.  Normal appearance of the mandible.  Normal appearance of the paranasal sinuses and mastoid air cells. No air fluid levels.  There is mild leftward nasal septal deviation.  Normal appearance of the imaged superior aspect of the cervical spine.  The imaged intracranial structures appear normal.  IMPRESSION: Soft tissue swelling about the medial aspect of the left maxilla extending to involve the majority of the left cheek and base of the left side of the nose.  While the affected soft tissues demonstrate diffuse mottled enhancement, there is a central approximately 1.1 cm area of non enhancement adjacent to the base of the left side of the nose which may represent a developing poorly defined subcutaneous abscess.  Clinical correlation is recommended.   Original Report  Authenticated By: Tacey Ruiz, MD   1. Facial cellulitis     MDM  Will add mupiricin.  Nothing to drain.  Follow up with Dr. Suszanne Conners  Georgette Helmer Smitty Cords, MD 02/05/13 1478

## 2013-04-10 ENCOUNTER — Emergency Department (HOSPITAL_BASED_OUTPATIENT_CLINIC_OR_DEPARTMENT_OTHER): Payer: Medicaid Other

## 2013-04-10 ENCOUNTER — Emergency Department (HOSPITAL_BASED_OUTPATIENT_CLINIC_OR_DEPARTMENT_OTHER)
Admission: EM | Admit: 2013-04-10 | Discharge: 2013-04-10 | Disposition: A | Discharge status: Home / Self Care | Payer: Medicaid Other | Attending: Emergency Medicine | Admitting: Emergency Medicine

## 2013-04-10 ENCOUNTER — Encounter (HOSPITAL_BASED_OUTPATIENT_CLINIC_OR_DEPARTMENT_OTHER): Payer: Self-pay | Admitting: Emergency Medicine

## 2013-04-10 DIAGNOSIS — M542 Cervicalgia: Secondary | ICD-10-CM

## 2013-04-10 DIAGNOSIS — F17200 Nicotine dependence, unspecified, uncomplicated: Secondary | ICD-10-CM | POA: Insufficient documentation

## 2013-04-10 DIAGNOSIS — Z791 Long term (current) use of non-steroidal anti-inflammatories (NSAID): Secondary | ICD-10-CM | POA: Insufficient documentation

## 2013-04-10 DIAGNOSIS — Y92410 Unspecified street and highway as the place of occurrence of the external cause: Secondary | ICD-10-CM | POA: Insufficient documentation

## 2013-04-10 DIAGNOSIS — Y9389 Activity, other specified: Secondary | ICD-10-CM | POA: Insufficient documentation

## 2013-04-10 DIAGNOSIS — IMO0002 Reserved for concepts with insufficient information to code with codable children: Secondary | ICD-10-CM | POA: Insufficient documentation

## 2013-04-10 DIAGNOSIS — S0993XA Unspecified injury of face, initial encounter: Secondary | ICD-10-CM | POA: Insufficient documentation

## 2013-04-10 DIAGNOSIS — T148XXA Other injury of unspecified body region, initial encounter: Secondary | ICD-10-CM

## 2013-04-10 DIAGNOSIS — S0003XA Contusion of scalp, initial encounter: Secondary | ICD-10-CM | POA: Insufficient documentation

## 2013-04-10 DIAGNOSIS — M549 Dorsalgia, unspecified: Secondary | ICD-10-CM

## 2013-04-10 MED ORDER — HYDROCODONE-ACETAMINOPHEN 5-325 MG PO TABS: 1.0000 | ORAL_TABLET | ORAL | Status: DC | PRN
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
## 2013-04-15 NOTE — ED Provider Notes (Signed)
Medical screening examination/treatment/procedure(s) were performed by non-physician practitioner and as supervising physician I was immediately available for consultation/collaboration.   Adonis Yim N Sumeya Yontz, DO 04/15/13 2048 

## 2013-04-24 ENCOUNTER — Encounter (HOSPITAL_BASED_OUTPATIENT_CLINIC_OR_DEPARTMENT_OTHER): Payer: Self-pay | Admitting: Emergency Medicine

## 2013-04-24 ENCOUNTER — Emergency Department (HOSPITAL_BASED_OUTPATIENT_CLINIC_OR_DEPARTMENT_OTHER): Payer: Medicaid Other

## 2013-04-24 ENCOUNTER — Emergency Department (HOSPITAL_BASED_OUTPATIENT_CLINIC_OR_DEPARTMENT_OTHER)
Admission: EM | Admit: 2013-04-24 | Discharge: 2013-04-24 | Disposition: A | Discharge status: Home / Self Care | Payer: Medicaid Other | Attending: Emergency Medicine | Admitting: Emergency Medicine

## 2013-04-24 DIAGNOSIS — N760 Acute vaginitis: Secondary | ICD-10-CM | POA: Insufficient documentation

## 2013-04-24 DIAGNOSIS — R110 Nausea: Secondary | ICD-10-CM | POA: Insufficient documentation

## 2013-04-24 DIAGNOSIS — R1011 Right upper quadrant pain: Secondary | ICD-10-CM | POA: Insufficient documentation

## 2013-04-24 DIAGNOSIS — N390 Urinary tract infection, site not specified: Secondary | ICD-10-CM | POA: Insufficient documentation

## 2013-04-24 DIAGNOSIS — F17200 Nicotine dependence, unspecified, uncomplicated: Secondary | ICD-10-CM | POA: Insufficient documentation

## 2013-04-24 DIAGNOSIS — A499 Bacterial infection, unspecified: Secondary | ICD-10-CM | POA: Insufficient documentation

## 2013-04-24 DIAGNOSIS — B9689 Other specified bacterial agents as the cause of diseases classified elsewhere: Secondary | ICD-10-CM | POA: Insufficient documentation

## 2013-04-24 DIAGNOSIS — Z3202 Encounter for pregnancy test, result negative: Secondary | ICD-10-CM | POA: Insufficient documentation

## 2013-04-24 LAB — LIPASE, BLOOD: Lipase: 10 U/L — ABNORMAL LOW (ref 11–59)

## 2013-04-24 LAB — COMPREHENSIVE METABOLIC PANEL
AST: 14 U/L (ref 0–37)
Albumin: 3.9 g/dL (ref 3.5–5.2)
Alkaline Phosphatase: 70 U/L (ref 39–117)
Chloride: 99 mEq/L (ref 96–112)
Creatinine, Ser: 0.8 mg/dL (ref 0.50–1.10)
Potassium: 4 mEq/L (ref 3.5–5.1)
Total Bilirubin: 0.5 mg/dL (ref 0.3–1.2)
Total Protein: 7.5 g/dL (ref 6.0–8.3)

## 2013-04-24 LAB — URINALYSIS, ROUTINE W REFLEX MICROSCOPIC
Bilirubin Urine: NEGATIVE
Glucose, UA: NEGATIVE mg/dL
Ketones, ur: NEGATIVE mg/dL
Nitrite: POSITIVE — AB
pH: 6.5 (ref 5.0–8.0)

## 2013-04-24 LAB — WET PREP, GENITAL: Yeast Wet Prep HPF POC: NONE SEEN

## 2013-04-24 LAB — CBC WITH DIFFERENTIAL/PLATELET
Basophils Absolute: 0 10*3/uL (ref 0.0–0.1)
Eosinophils Absolute: 0.1 10*3/uL (ref 0.0–0.7)
Lymphocytes Relative: 20 % (ref 12–46)
MCH: 32.8 pg (ref 26.0–34.0)
MCHC: 34.8 g/dL (ref 30.0–36.0)
Monocytes Absolute: 1.2 10*3/uL — ABNORMAL HIGH (ref 0.1–1.0)
Neutrophils Relative %: 63 % (ref 43–77)
RDW: 12.8 % (ref 11.5–15.5)

## 2013-04-24 LAB — URINE MICROSCOPIC-ADD ON

## 2013-04-24 LAB — PREGNANCY, URINE: Preg Test, Ur: NEGATIVE

## 2013-04-24 MED ORDER — KETOROLAC TROMETHAMINE 30 MG/ML IJ SOLN
30.0000 mg | Freq: Once | INTRAMUSCULAR | Status: AC
  Administered 2013-04-24: 30 mg via INTRAVENOUS
  Filled 2013-04-24: qty 1

## 2013-04-24 MED ORDER — CEPHALEXIN 500 MG PO CAPS: 500.0000 mg | ORAL_CAPSULE | Freq: Four times a day (QID) | ORAL | Status: DC

## 2013-04-24 MED ORDER — ONDANSETRON HCL 4 MG/2ML IJ SOLN
4.0000 mg | Freq: Once | INTRAMUSCULAR | Status: AC
  Administered 2013-04-24: 4 mg via INTRAVENOUS
  Filled 2013-04-24: qty 2

## 2013-04-24 MED ORDER — METRONIDAZOLE 500 MG PO TABS: 500.0000 mg | ORAL_TABLET | Freq: Two times a day (BID) | ORAL | Status: DC

## 2013-04-24 MED ORDER — MORPHINE SULFATE 4 MG/ML IJ SOLN
4.0000 mg | Freq: Once | INTRAMUSCULAR | Status: AC
  Administered 2013-04-24: 4 mg via INTRAVENOUS
  Filled 2013-04-24: qty 1

## 2013-04-24 MED ORDER — CEFTRIAXONE SODIUM 1 G IJ SOLR
INTRAMUSCULAR | Status: AC
  Filled 2013-04-24: qty 10

## 2013-04-24 MED ORDER — CEFTRIAXONE SODIUM 1 G IJ SOLR
1.0000 g | Freq: Once | INTRAMUSCULAR | Status: AC
  Administered 2013-04-24: 1 g via INTRAVENOUS

## 2013-04-24 MED ORDER — SODIUM CHLORIDE 0.9 % IV BOLUS (SEPSIS)
1000.0000 mL | Freq: Once | INTRAVENOUS | Status: AC
  Administered 2013-04-24: 1000 mL via INTRAVENOUS

## 2013-04-24 NOTE — ED Notes (Signed)
Right flank pain radiating to RUQ x 3 days worsening last night.  She also reports vaginal d/c.

## 2013-04-24 NOTE — ED Notes (Signed)
PO fluids provided. 

## 2013-04-24 NOTE — ED Provider Notes (Signed)
  Medical screening examination/treatment/procedure(s) were performed by non-physician practitioner and as supervising physician I was immediately available for consultation/collaboration.    Delmon Andrada, MD 04/24/13 1508 

## 2013-04-24 NOTE — ED Provider Notes (Signed)
CSN: [id number]     Arrival date & time 04/24/13  1128 History   First MD Initiated Contact with Patient 04/24/13 1207     Chief Complaint  Patient presents with  . Flank Pain  . Vaginal Discharge   (Consider location/radiation/quality/duration/timing/severity/associated sxs/prior Treatment) HPI Comments: Patient is a 22 year old female with no past medical history who presents with a 3 day history of right flank pain. The pain is located in her right flank and radiates around to her RUQ. The pain is described as aching and severe. The pain started gradually and progressively worsened since the onset. No alleviating/aggravating factors. The patient has tried nothing for symptoms without relief. Associated symptoms include nausea and vaginal discharge. Patient denies fever, headache, vomiting, diarrhea, chest pain, SOB, dysuria, constipation.    History reviewed. No pertinent past medical history. Past Surgical History  Procedure Laterality Date  . Appendectomy     No family history on file. History  Substance Use Topics  . Smoking status: Current Every Day Smoker -- 0.50 packs/day    Types: Cigarettes  . Smokeless tobacco: Not on file  . Alcohol Use: Yes     Comment: occasional   OB History   Grav Para Term Preterm Abortions TAB SAB Ect Mult Living                 Review of Systems  Gastrointestinal: Positive for nausea and abdominal pain.  Genitourinary: Positive for flank pain and vaginal discharge.  All other systems reviewed and are negative.    Allergies  Review of patient's allergies indicates no known allergies.  Home Medications  No current outpatient prescriptions on file. BP 104/50  Pulse 112  Temp(Src) 97.9 F (36.6 C) (Oral)  Resp 18  Ht 5\' 4"  (1.626 m)  Wt 125 lb (56.7 kg)  BMI 21.45 kg/m2  SpO2 99%  LMP 04/05/2013 Physical Exam  Nursing note and vitals reviewed. Constitutional: She is oriented to person, place, and time. She appears well-developed  and well-nourished. No distress.  HENT:  Head: Normocephalic and atraumatic.  Eyes: Conjunctivae and EOM are normal.  Neck: Normal range of motion.  Cardiovascular: Normal rate and regular rhythm.  Exam reveals no gallop and no friction rub.   No murmur heard. Pulmonary/Chest: Effort normal and breath sounds normal. She has no wheezes. She has no rales. She exhibits no tenderness.  Abdominal: Soft. She exhibits no distension. There is tenderness. There is no rebound and no guarding.  RUQ tenderness to palpation. No other focal tenderness to palpation or peritoneal signs.   Genitourinary:  Right CVA tenderness.   Musculoskeletal: Normal range of motion.  Neurological: She is alert and oriented to person, place, and time. Coordination normal.  Speech is goal-oriented. Moves limbs without ataxia.   Skin: Skin is warm and dry.  Psychiatric: She has a normal mood and affect. Her behavior is normal.    ED Course  Procedures (including critical care time) Labs Review Labs Reviewed  WET PREP, GENITAL - Abnormal; Notable for the following:    Clue Cells Wet Prep HPF POC FEW (*)    WBC, Wet Prep HPF POC MODERATE (*)    All other components within normal limits  URINALYSIS, ROUTINE W REFLEX MICROSCOPIC - Abnormal; Notable for the following:    APPearance CLOUDY (*)    Hgb urine dipstick MODERATE (*)    Nitrite POSITIVE (*)    Leukocytes, UA MODERATE (*)    All other components within normal limits  CBC WITH DIFFERENTIAL - Abnormal; Notable for the following:    Platelets 115 (*)    Monocytes Relative 16 (*)    Monocytes Absolute 1.2 (*)    All other components within normal limits  COMPREHENSIVE METABOLIC PANEL - Abnormal; Notable for the following:    Glucose, Bld 100 (*)    All other components within normal limits  LIPASE, BLOOD - Abnormal; Notable for the following:    Lipase 10 (*)    All other components within normal limits  URINE MICROSCOPIC-ADD ON - Abnormal; Notable for the  following:    Squamous Epithelial / LPF FEW (*)    Bacteria, UA MANY (*)    All other components within normal limits  URINE CULTURE  GC/CHLAMYDIA PROBE AMP  PREGNANCY, URINE   Imaging Review Ct Abdomen Pelvis Wo Contrast  04/24/2013   CLINICAL DATA:  Right flank pain.  EXAM: CT ABDOMEN AND PELVIS WITHOUT CONTRAST  TECHNIQUE: Multidetector CT imaging of the abdomen and pelvis was performed following the standard protocol without intravenous contrast.  COMPARISON:  None.  FINDINGS: Lung bases show triangular-shaped nodular densities in the subpleural left lower lobe, measuring 4 mm or less in size. These likely represent subpleural lymph nodes in a patient of this age. Heart size normal. No pericardial or pleural effusion.  Liver, gallbladder, adrenal glands, kidneys and spleen are unremarkable. Pancreas appears somewhat fatty replaced. Stomach, small bowel and colon are unremarkable. Small pelvic free fluid. Uterus and ovaries are visualized. No pathologically enlarged lymph nodes. No worrisome lytic or sclerotic lesions.  IMPRESSION: Small pelvic free fluid.  No urinary stones or obstruction.   Electronically Signed   By: Leanna Battles M.D.   On: 04/24/2013 13:10    EKG Interpretation   None       MDM   1. UTI (urinary tract infection)   2. BV (bacterial vaginosis)     12:25 PM Labs pending. Urinalysis shows negative pregnancy and UTI. Patient will have CT abdomen pelvis to rule out kidney stone. Patient will have toradol and zofran for symptoms. Vitals stable, patient tachycardic and afebrile.   3:05 PM Wet prep shows clue cells. Patient will be treated for BV. Patient will be discharged with Keflex and Flagyl. Vitals stable and patient afebrile. Patient instructed to return with worsening or concerning symptoms. No further evaluation needed at this time.    Emilia Beck, PA-C 04/24/13 1506

## 2013-04-25 LAB — URINE CULTURE

## 2013-06-23 ENCOUNTER — Encounter (HOSPITAL_BASED_OUTPATIENT_CLINIC_OR_DEPARTMENT_OTHER): Payer: Self-pay | Admitting: Emergency Medicine

## 2013-06-23 ENCOUNTER — Emergency Department (HOSPITAL_BASED_OUTPATIENT_CLINIC_OR_DEPARTMENT_OTHER)
Admission: EM | Admit: 2013-06-23 | Discharge: 2013-06-24 | Disposition: A | Discharge status: Home / Self Care | Payer: No Typology Code available for payment source | Attending: Emergency Medicine | Admitting: Emergency Medicine

## 2013-06-23 DIAGNOSIS — O2390 Unspecified genitourinary tract infection in pregnancy, unspecified trimester: Secondary | ICD-10-CM | POA: Insufficient documentation

## 2013-06-23 DIAGNOSIS — O99330 Smoking (tobacco) complicating pregnancy, unspecified trimester: Secondary | ICD-10-CM | POA: Insufficient documentation

## 2013-06-23 DIAGNOSIS — N390 Urinary tract infection, site not specified: Secondary | ICD-10-CM | POA: Insufficient documentation

## 2013-06-23 DIAGNOSIS — A499 Bacterial infection, unspecified: Secondary | ICD-10-CM | POA: Insufficient documentation

## 2013-06-23 DIAGNOSIS — N760 Acute vaginitis: Secondary | ICD-10-CM | POA: Insufficient documentation

## 2013-06-23 DIAGNOSIS — B9689 Other specified bacterial agents as the cause of diseases classified elsewhere: Secondary | ICD-10-CM | POA: Insufficient documentation

## 2013-06-23 LAB — URINALYSIS, ROUTINE W REFLEX MICROSCOPIC
Glucose, UA: NEGATIVE mg/dL
Hgb urine dipstick: NEGATIVE
Specific Gravity, Urine: 1.025 (ref 1.005–1.030)
Urobilinogen, UA: 1 mg/dL (ref 0.0–1.0)
pH: 6.5 (ref 5.0–8.0)

## 2013-06-23 LAB — URINE MICROSCOPIC-ADD ON

## 2013-06-23 LAB — WET PREP, GENITAL
Trich, Wet Prep: NONE SEEN
Yeast Wet Prep HPF POC: NONE SEEN

## 2013-06-23 LAB — PREGNANCY, URINE: Preg Test, Ur: POSITIVE — AB

## 2013-06-23 NOTE — ED Provider Notes (Signed)
CSN: [id number]     Arrival date & time 06/23/13  2250 History  This chart was scribed for Sydney Opperman Smitty Cords, MD by Dorothey Baseman, ED Scribe. This patient was seen in room MH01/MH01 and the patient's care was started at 11:26 PM.    Chief Complaint  Patient presents with  . Vaginal Discharge   Patient is a 22 y.o. female presenting with vaginal discharge. The history is provided by the patient. No language interpreter was used.  Vaginal Discharge Quality:  White Severity:  Moderate Onset quality:  Sudden Timing:  Constant Chronicity:  Recurrent Context: during pregnancy    HPI Comments: Sydney Carpenter is a 22 y.o. Female that is currently approximately [redacted] weeks pregnant who presents to the Emergency Department complaining of a white vaginal discharge onset about a week ago. Patient is followed by Dr. Arlyce Harman and Dr. Okey Dupre at [facility] for her pregnancy and states that she has an appointment to follow up in 4 days. Patient was seen here in October and treated for bacterial vaginosis with Keflex and Flagyl. Patient reports a history of yeast infections that she states usually present following a course of antibiotics.  History reviewed. No pertinent past medical history. Past Surgical History  Procedure Laterality Date  . Appendectomy     History reviewed. No pertinent family history. History  Substance Use Topics  . Smoking status: Current Every Day Smoker -- 0.50 packs/day    Types: Cigarettes  . Smokeless tobacco: Not on file  . Alcohol Use: Yes     Comment: occasional   OB History   Grav Para Term Preterm Abortions TAB SAB Ect Mult Living                 Review of Systems  Genitourinary: Positive for vaginal discharge.  All other systems reviewed and are negative.    Allergies  Review of patient's allergies indicates no known allergies.  Home Medications  No current outpatient prescriptions on file.  Triage Vitals: BP 132/72  Pulse 112  Temp(Src) 97.7 F  (36.5 C) (Oral)  Resp 16  Ht 5\' 3"  (1.6 m)  Wt 129 lb (58.514 kg)  BMI 22.86 kg/m2  SpO2 100%  LMP 04/05/2013  Physical Exam  Nursing note and vitals reviewed. Constitutional: She is oriented to person, place, and time. She appears well-developed and well-nourished. No distress.  HENT:  Head: Normocephalic and atraumatic.  Mouth/Throat: Oropharynx is clear and moist. No oropharyngeal exudate.  Eyes: Conjunctivae are normal. Pupils are equal, round, and reactive to light.  Neck: Normal range of motion. Neck supple.  Cardiovascular: Normal rate, regular rhythm and normal heart sounds.  Exam reveals no gallop and no friction rub.   No murmur heard. Pulmonary/Chest: Effort normal and breath sounds normal. No respiratory distress.  Abdominal: Soft. Bowel sounds are normal. She exhibits no distension.  Genitourinary:  No CMT. No adnexal tenderness. Os is closed. White, filmy discharge present. Chaperone present.   Musculoskeletal: Normal range of motion.  Neurological: She is alert and oriented to person, place, and time.  Skin: Skin is warm and dry.  Psychiatric: She has a normal mood and affect. Her behavior is normal.    ED Course  Procedures (including critical care time)  DIAGNOSTIC STUDIES: Oxygen Saturation is 100% on room air, normal by my interpretation.    COORDINATION OF CARE: 11:30 PM- Ordered UA and a wet prep. Discussed treatment plan with patient at bedside and patient verbalized agreement.     Labs Review  Labs Reviewed  PREGNANCY, URINE - Abnormal; Notable for the following:    Preg Test, Ur POSITIVE (*)    All other components within normal limits  URINALYSIS, ROUTINE W REFLEX MICROSCOPIC - Abnormal; Notable for the following:    APPearance CLOUDY (*)    Leukocytes, UA SMALL (*)    All other components within normal limits  URINE MICROSCOPIC-ADD ON - Abnormal; Notable for the following:    Squamous Epithelial / LPF MANY (*)    Bacteria, UA MANY (*)     All other components within normal limits  WET PREP, GENITAL  GC/CHLAMYDIA PROBE AMP  URINE CULTURE   Imaging Review No results found.  EKG Interpretation   None       MDM  No diagnosis found. Will treat for UTI ans BV with metrogel.  Follow up this week with your OB  I personally performed the services described in this documentation, which was scribed in my presence. The recorded information has been reviewed and is accurate.     Jasmine Awe, MD 06/24/13 [phone]

## 2013-06-23 NOTE — ED Notes (Addendum)
Pt c/o vaginal discharge x 1 week pt is 11 weeks preg and has some abd cramping , denies no vaginal bleeding

## 2013-06-24 LAB — GC/CHLAMYDIA PROBE AMP
CT Probe RNA: NEGATIVE
GC Probe RNA: NEGATIVE

## 2013-06-24 MED ORDER — NITROFURANTOIN MONOHYD MACRO 100 MG PO CAPS: 100.0000 mg | ORAL_CAPSULE | Freq: Two times a day (BID) | ORAL | Status: DC

## 2013-06-24 MED ORDER — METRONIDAZOLE 0.75 % VA GEL: 1.0000 | Freq: Two times a day (BID) | VAGINAL | Status: DC

## 2013-06-25 LAB — URINE CULTURE: Colony Count: 60000

## 2013-06-26 NOTE — Progress Notes (Signed)
ED Antimicrobial Stewardship Positive Culture Follow Up   Sydney Carpenter is an 22 y.o. female who presented to [facility] on 06/23/2013 with a chief complaint of  Chief Complaint  Patient presents with  . Vaginal Discharge    Recent Results (from the past 720 hour(s))  URINE CULTURE     Status: None   Collection Time    06/23/13 11:09 PM      Result Value Range Status   Specimen Description URINE, CLEAN CATCH   Final   Special Requests NONE   Final   Culture  Setup Time     Final   Value: 06/24/2013 05:36     Performed at [facility] Count     Final   Value: 60,000 COLONIES/ML     Performed at [facility]   Culture     Final   Value: KLEBSIELLA PNEUMONIAE     Performed at [facility]   Report Status 06/25/2013 FINAL   Final   Organism ID, Bacteria KLEBSIELLA PNEUMONIAE   Final  WET PREP, GENITAL     Status: Abnormal   Collection Time    06/23/13 11:30 PM      Result Value Range Status   Yeast Wet Prep HPF POC NONE SEEN  NONE SEEN Final   Trich, Wet Prep NONE SEEN  NONE SEEN Final   Clue Cells Wet Prep HPF POC FEW (*) NONE SEEN Final   WBC, Wet Prep HPF POC MODERATE (*) NONE SEEN Final  GC/CHLAMYDIA PROBE AMP     Status: None   Collection Time    06/23/13 11:30 PM      Result Value Range Status   CT Probe RNA NEGATIVE  NEGATIVE Final   GC Probe RNA NEGATIVE  NEGATIVE Final   Comment: (NOTE)                                                                                               **Normal Reference Range: Negative**          Assay performed using the Gen-Probe APTIMA COMBO2 (R) Assay.     Acceptable specimen types for this assay include APTIMA Swabs (Unisex,     endocervical, urethral, or vaginal), first void urine, and ThinPrep     liquid based cytology samples.     Performed at [facility]    [x]  Treated with Nitrofurantoin, organism may be resistant to prescribed antimicrobial  ED Flow Manager to call patient for a  symptom check. If her symptoms are improved she should finish her course of Nitrofurantoin. If her symptoms persist she should stop Nitrofurantoin and start the prescription below.  New antibiotic prescription: Keflex 500mg  PO TID x 7 days.    She must follow up with OB for recheck this week.  ED Provider: Trixie Dredge, PA-C   Sallee Provencal 06/26/2013, 11:47 AM Infectious Diseases Pharmacist Phone# [phone]

## 2013-06-26 NOTE — ED Notes (Signed)
Post ED Visit - Positive Culture Follow-up: Successful Patient Follow-Up  Culture assessed and recommendations reviewed by: []  Wes Dulaney, Pharm.D., BCPS []  Celedonio Miyamoto, Pharm.D., BCPS []  Georgina Pillion, Pharm.D., BCPS []  [place], [address].D., BCPS, AAHIVP [x]  Estella Husk, Pharm.D., BCPS, AAHIVP  Positive urine culture  []  Patient discharged without antimicrobial prescription and treatment is now indicated [x]  Organism is resistant to prescribed ED discharge antimicrobial []  Patient with positive blood cultures  Changes discussed with ED provider:  Trixie Dredge finishNitrofurantion New antibiotic prescription:Keflex 500 mg po tid x 7 days  Larena Sox 06/26/2013, 7:47 PM

## 2013-06-27 ENCOUNTER — Telehealth (HOSPITAL_COMMUNITY): Payer: Self-pay | Admitting: *Deleted

## 2014-02-14 ENCOUNTER — Encounter (HOSPITAL_BASED_OUTPATIENT_CLINIC_OR_DEPARTMENT_OTHER): Payer: Self-pay | Admitting: Emergency Medicine

## 2014-02-14 ENCOUNTER — Emergency Department (HOSPITAL_BASED_OUTPATIENT_CLINIC_OR_DEPARTMENT_OTHER)
Admission: EM | Admit: 2014-02-14 | Discharge: 2014-02-14 | Disposition: A | Discharge status: Home / Self Care | Payer: No Typology Code available for payment source | Attending: Emergency Medicine | Admitting: Emergency Medicine

## 2014-02-14 DIAGNOSIS — S335XXA Sprain of ligaments of lumbar spine, initial encounter: Secondary | ICD-10-CM | POA: Insufficient documentation

## 2014-02-14 DIAGNOSIS — S39012A Strain of muscle, fascia and tendon of lower back, initial encounter: Secondary | ICD-10-CM

## 2014-02-14 DIAGNOSIS — Y929 Unspecified place or not applicable: Secondary | ICD-10-CM | POA: Insufficient documentation

## 2014-02-14 DIAGNOSIS — Z792 Long term (current) use of antibiotics: Secondary | ICD-10-CM | POA: Insufficient documentation

## 2014-02-14 DIAGNOSIS — Y9389 Activity, other specified: Secondary | ICD-10-CM | POA: Insufficient documentation

## 2014-02-14 DIAGNOSIS — F17200 Nicotine dependence, unspecified, uncomplicated: Secondary | ICD-10-CM | POA: Insufficient documentation

## 2014-02-14 DIAGNOSIS — IMO0002 Reserved for concepts with insufficient information to code with codable children: Secondary | ICD-10-CM | POA: Insufficient documentation

## 2014-02-14 DIAGNOSIS — X500XXA Overexertion from strenuous movement or load, initial encounter: Secondary | ICD-10-CM | POA: Insufficient documentation

## 2014-02-14 MED ORDER — IBUPROFEN 600 MG PO TABS: 600.0000 mg | ORAL_TABLET | Freq: Three times a day (TID) | ORAL | Status: DC | PRN

## 2014-02-14 MED ORDER — CYCLOBENZAPRINE HCL 10 MG PO TABS
10.0000 mg | ORAL_TABLET | Freq: Once | ORAL | Status: AC
  Administered 2014-02-14: 10 mg via ORAL
  Filled 2014-02-14: qty 1

## 2014-02-14 MED ORDER — HYDROCODONE-ACETAMINOPHEN 5-325 MG PO TABS: 1.0000 | ORAL_TABLET | ORAL | Status: DC | PRN

## 2014-02-14 MED ORDER — CYCLOBENZAPRINE HCL 10 MG PO TABS: 10.0000 mg | ORAL_TABLET | Freq: Three times a day (TID) | ORAL | Status: DC | PRN

## 2014-02-14 MED ORDER — IBUPROFEN 400 MG PO TABS
600.0000 mg | ORAL_TABLET | Freq: Once | ORAL | Status: AC
  Administered 2014-02-14: 16:00:00 600 mg via ORAL
  Filled 2014-02-14 (×2): qty 1

## 2014-02-14 MED ORDER — HYDROCODONE-ACETAMINOPHEN 5-325 MG PO TABS
2.0000 | ORAL_TABLET | Freq: Once | ORAL | Status: AC
  Administered 2014-02-14: 2 via ORAL
  Filled 2014-02-14: qty 2

## 2014-02-14 NOTE — Discharge Instructions (Signed)
Lumbosacral Strain  Lumbosacral strain is a strain of any of the parts that make up your lumbosacral vertebrae. Your lumbosacral vertebrae are the bones that make up the lower third of your backbone. Your lumbosacral vertebrae are held together by muscles and tough, fibrous tissue (ligaments).   CAUSES   A sudden blow to your back can cause lumbosacral strain. Also, anything that causes an excessive stretch of the muscles in the low back can cause this strain. This is typically seen when people exert themselves strenuously, fall, lift heavy objects, bend, or crouch repeatedly.  RISK FACTORS   Physically demanding work.   Participation in pushing or pulling sports or sports that require a sudden twist of the back (tennis, golf, baseball).   Weight lifting.   Excessive lower back curvature.   Forward-tilted pelvis.   Weak back or abdominal muscles or both.   Tight hamstrings.  SIGNS AND SYMPTOMS   Lumbosacral strain may cause pain in the area of your injury or pain that moves (radiates) down your leg.   DIAGNOSIS  Your health care provider can often diagnose lumbosacral strain through a physical exam. In some cases, you may need tests such as X-ray exams.   TREATMENT   Treatment for your lower back injury depends on many factors that your clinician will have to evaluate. However, most treatment will include the use of anti-inflammatory medicines.  HOME CARE INSTRUCTIONS    Avoid hard physical activities (tennis, racquetball, waterskiing) if you are not in proper physical condition for it. This may aggravate or create problems.   If you have a back problem, avoid sports requiring sudden body movements. Swimming and walking are generally safer activities.   Maintain good posture.   Maintain a healthy weight.   For acute conditions, you may put ice on the injured area.   Put ice in a plastic bag.   Place a towel between your skin and the bag.   Leave the ice on for 20 minutes, 2-3 times a day.   When the  low back starts healing, stretching and strengthening exercises may be recommended.  SEEK MEDICAL CARE IF:   Your back pain is getting worse.   You experience severe back pain not relieved with medicines.  SEEK IMMEDIATE MEDICAL CARE IF:    You have numbness, tingling, weakness, or problems with the use of your arms or legs.   There is a change in bowel or bladder control.   You have increasing pain in any area of the body, including your belly (abdomen).   You notice shortness of breath, dizziness, or feel faint.   You feel sick to your stomach (nauseous), are throwing up (vomiting), or become sweaty.   You notice discoloration of your toes or legs, or your feet get very cold.  MAKE SURE YOU:    Understand these instructions.   Will watch your condition.   Will get help right away if you are not doing well or get worse.  Document Released: 04/06/2005 Document Revised: 07/02/2013 Document Reviewed: 02/13/2013  ExitCare Patient Information 2015 [employer]. This information is not intended to replace advice given to you by your health care provider. Make sure you discuss any questions you have with your health care provider.  Back Exercises  Back exercises help treat and prevent back injuries. The goal of back exercises is to increase the strength of your abdominal and back muscles and the flexibility of your back. These exercises should be started when you no longer   have back pain. Back exercises include:   Pelvic Tilt. Lie on your back with your knees bent. Tilt your pelvis until the lower part of your back is against the floor. Hold this position 5 to 10 sec and repeat 5 to 10 times.   Knee to Chest. Pull first 1 knee up against your chest and hold for 20 to 30 seconds, repeat this with the other knee, and then both knees. This may be done with the other leg straight or bent, whichever feels better.   Sit-Ups or Curl-Ups. Bend your knees 90 degrees. Start with tilting your pelvis, and do a  partial, slow sit-up, lifting your trunk only 30 to 45 degrees off the floor. Take at least 2 to 3 seconds for each sit-up. Do not do sit-ups with your knees out straight. If partial sit-ups are difficult, simply do the above but with only tightening your abdominal muscles and holding it as directed.   Hip-Lift. Lie on your back with your knees flexed 90 degrees. Push down with your feet and shoulders as you raise your hips a couple inches off the floor; hold for 10 seconds, repeat 5 to 10 times.   Back arches. Lie on your stomach, propping yourself up on bent elbows. Slowly press on your hands, causing an arch in your low back. Repeat 3 to 5 times. Any initial stiffness and discomfort should lessen with repetition over time.   Shoulder-Lifts. Lie face down with arms beside your body. Keep hips and torso pressed to floor as you slowly lift your head and shoulders off the floor.  Do not overdo your exercises, especially in the beginning. Exercises may cause you some mild back discomfort which lasts for a few minutes; however, if the pain is more severe, or lasts for more than 15 minutes, do not continue exercises until you see your caregiver. Improvement with exercise therapy for back problems is slow.   See your caregivers for assistance with developing a proper back exercise program.  Document Released: 08/04/2004 Document Revised: 09/19/2011 Document Reviewed: 04/28/2011  ExitCare Patient Information 2015 [employer]. This information is not intended to replace advice given to you by your health care provider. Make sure you discuss any questions you have with your health care provider.  Back Pain, Adult  Low back pain is very common. About 1 in 5 people have back pain.The cause of low back pain is rarely dangerous. The pain often gets better over time.About half of people with a sudden onset of back pain feel better in just 2 weeks. About 8 in 10 people feel better by 6 weeks.   CAUSES  Some common causes  of back pain include:   Strain of the muscles or ligaments supporting the spine.   Wear and tear (degeneration) of the spinal discs.   Arthritis.   Direct injury to the back.  DIAGNOSIS  Most of the time, the direct cause of low back pain is not known.However, back pain can be treated effectively even when the exact cause of the pain is unknown.Answering your caregiver's questions about your overall health and symptoms is one of the most accurate ways to make sure the cause of your pain is not dangerous. If your caregiver needs more information, he or she may order lab work or imaging tests (X-rays or MRIs).However, even if imaging tests show changes in your back, this usually does not require surgery.  HOME CARE INSTRUCTIONS  For many people, back pain returns.Since low back pain   increase the length of time you walk each day.  Do not stay in bed.Resting more than 1 or 2 days can delay your recovery.  Do not avoid exercise or work.Your body is made to move.It is not dangerous to be active, even though your back may hurt.Your back will likely heal faster if you return to being active before your pain is gone.  Pay attention to your body when you bend and lift. Many people have less discomfortwhen lifting if they bend their knees, keep the load close to their bodies,and avoid twisting. Often, the most comfortable positions are those that put less stress on your recovering back.  Find a comfortable position to sleep. Use a firm mattress and lie on your side with your knees slightly bent. If you lie on your back, put a pillow under your knees.  Only take over-the-counter or  prescription medicines as directed by your caregiver. Over-the-counter medicines to reduce pain and inflammation are often the most helpful.Your caregiver may prescribe muscle relaxant drugs.These medicines help dull your pain so you can more quickly return to your normal activities and healthy exercise.  Put ice on the injured area.  Put ice in a plastic bag.  Place a towel between your skin and the bag.  Leave the ice on for 15-20 minutes, 03-04 times a day for the first 2 to 3 days. After that, ice and heat may be alternated to reduce pain and spasms.  Ask your caregiver about trying back exercises and gentle massage. This may be of some benefit.  Avoid feeling anxious or stressed.Stress increases muscle tension and can worsen back pain.It is important to recognize when you are anxious or stressed and learn ways to manage it.Exercise is a great option. SEEK MEDICAL CARE IF:  You have pain that is not relieved with rest or medicine.  You have pain that does not improve in 1 week.  You have new symptoms.  You are generally not feeling well. SEEK IMMEDIATE MEDICAL CARE IF:   You have pain that radiates from your back into your legs.  You develop new bowel or bladder control problems.  You have unusual weakness or numbness in your arms or legs.  You develop nausea or vomiting.  You develop abdominal pain.  You feel faint. Document Released: 06/27/2005 Document Revised: 12/27/2011 Document Reviewed: 10/29/2013 [facility] Patient Information 2015 [employer]Care, [place]. This information is not intended to replace advice given to you by your health care provider. Make sure you discuss any questions you have with your health care provider.

## 2014-02-14 NOTE — ED Notes (Signed)
C/o lower back, right hip and leg pain-started after bending over to put groceries in refrigerator

## 2014-02-14 NOTE — ED Provider Notes (Signed)
CSN: [id number]     Arrival date & time 02/14/14  1500 History   First MD Initiated Contact with Patient 02/14/14 1521     Chief Complaint  Patient presents with  . Back Pain     (Consider location/radiation/quality/duration/timing/severity/associated sxs/prior Treatment) HPI 23 year old female presents about an hour after injuring her right back. She states she was squatting and bending over and had immediate pain in her right lower back. She feels the pain radiates down the outer right thigh does not go past her knee. Right near her hip she feels a small square area of numbness. Denies a weakness in her legs. Denies any bowel or bladder incontinence. The pain is currently an 8/10 and described as a needles and stabbing sensation. No fevers. Is able to ambulate but it hurts. Has not tried any medicine prior to arrival. No prior back issues.  History reviewed. No pertinent past medical history. Past Surgical History  Procedure Laterality Date  . Appendectomy     No family history on file. History  Substance Use Topics  . Smoking status: Current Every Day Smoker -- 0.50 packs/day    Types: Cigarettes  . Smokeless tobacco: Not on file  . Alcohol Use: Yes     Comment: occ   OB History   Grav Para Term Preterm Abortions TAB SAB Ect Mult Living                 Review of Systems  Constitutional: Negative for fever.  Gastrointestinal: Negative for abdominal pain.  Genitourinary: Negative for dysuria and menstrual problem.  Musculoskeletal: Positive for back pain.  Neurological: Positive for numbness. Negative for weakness.  All other systems reviewed and are negative.     Allergies  Review of patient's allergies indicates no known allergies.  Home Medications   Prior to Admission medications   Medication Sig Start Date End Date Taking? Authorizing Provider  metroNIDAZOLE (METROGEL VAGINAL) 0.75 % vaginal gel Place 1 Applicatorful vaginally 2 (two) times daily. 06/24/13    April K Palumbo-Rasch, MD  nitrofurantoin, macrocrystal-monohydrate, (MACROBID) 100 MG capsule Take 1 capsule (100 mg total) by mouth 2 (two) times daily. X 7 days 06/24/13   April K Palumbo-Rasch, MD   BP 115/68  Pulse 72  Temp(Src) 97.9 F (36.6 C) (Oral)  Resp 18  Ht 5\' 4"  (1.626 m)  Wt 123 lb (55.792 kg)  BMI 21.10 kg/m2  SpO2 100%  LMP 01/17/2014 Physical Exam  Nursing note and vitals reviewed. Constitutional: She is oriented to person, place, and time. She appears well-developed and well-nourished. No distress.  HENT:  Head: Normocephalic and atraumatic.  Right Ear: External ear normal.  Left Ear: External ear normal.  Nose: Nose normal.  Eyes: Right eye exhibits no discharge. Left eye exhibits no discharge.  Cardiovascular: Normal rate, regular rhythm and normal heart sounds.   Pulmonary/Chest: Effort normal and breath sounds normal.  Abdominal: Soft. She exhibits no distension. There is no tenderness. There is no CVA tenderness.  Musculoskeletal:       Thoracic back: She exhibits no tenderness and no bony tenderness.       Lumbar back: She exhibits tenderness (right lateral lower back.). She exhibits no bony tenderness.  Neurological: She is alert and oriented to person, place, and time. She displays no Babinski's sign on the right side. She displays no Babinski's sign on the left side.  Reflex Scores:      Patellar reflexes are 2+ on the right side and 2+ on the  left side.      Achilles reflexes are 2+ on the right side and 2+ on the left side. Normal strength in bilateral lower extremities. Grossly normal sensation bilaterally except at proximal right thigh. Small area of subjective numbness  Skin: Skin is warm and dry.    ED Course  Procedures (including critical care time) Labs Review Labs Reviewed - No data to display  Imaging Review No results found.   EKG Interpretation None      MDM   Final diagnoses:  Low back strain, initial encounter     Patient's presentation is c/w an acute muscular strain. Will treat symptomatically with NSAIDs, oral narcotics and flexeril. No bony tenderness or trauma to suggest needing imaging. Patient has a subjective area of numbness proximally but does not fit a dermatomal distribution. Neuro exam otherwise completely normal. Due to this, I have a very low suspicion that she has an acute spinal cord emergency causing her pain. No urinary symptoms, and this appears MSK related. Will treat symptomatically and give strict return precautions.    Audree CamelScott T Maziyah Vessel, MD 02/14/14 [id number]

## 2014-04-29 ENCOUNTER — Emergency Department (HOSPITAL_BASED_OUTPATIENT_CLINIC_OR_DEPARTMENT_OTHER)
Admission: EM | Admit: 2014-04-29 | Discharge: 2014-04-29 | Disposition: A | Discharge status: Home / Self Care | Payer: No Typology Code available for payment source | Attending: Emergency Medicine | Admitting: Emergency Medicine

## 2014-04-29 ENCOUNTER — Encounter (HOSPITAL_BASED_OUTPATIENT_CLINIC_OR_DEPARTMENT_OTHER): Payer: Self-pay | Admitting: Emergency Medicine

## 2014-04-29 DIAGNOSIS — R110 Nausea: Secondary | ICD-10-CM | POA: Insufficient documentation

## 2014-04-29 DIAGNOSIS — Y9289 Other specified places as the place of occurrence of the external cause: Secondary | ICD-10-CM | POA: Insufficient documentation

## 2014-04-29 DIAGNOSIS — Y9389 Activity, other specified: Secondary | ICD-10-CM | POA: Insufficient documentation

## 2014-04-29 DIAGNOSIS — Z720 Tobacco use: Secondary | ICD-10-CM | POA: Insufficient documentation

## 2014-04-29 DIAGNOSIS — W01190A Fall on same level from slipping, tripping and stumbling with subsequent striking against furniture, initial encounter: Secondary | ICD-10-CM | POA: Insufficient documentation

## 2014-04-29 DIAGNOSIS — S0990XA Unspecified injury of head, initial encounter: Secondary | ICD-10-CM

## 2014-04-29 DIAGNOSIS — R42 Dizziness and giddiness: Secondary | ICD-10-CM | POA: Insufficient documentation

## 2014-04-29 MED ORDER — ONDANSETRON 4 MG PO TBDP: ORAL_TABLET | ORAL | Status: DC

## 2014-04-29 MED ORDER — ONDANSETRON 4 MG PO TBDP
4.0000 mg | ORAL_TABLET | Freq: Once | ORAL | Status: AC
  Administered 2014-04-29: 4 mg via ORAL
  Filled 2014-04-29: qty 1

## 2014-04-29 NOTE — ED Notes (Signed)
Pt c/o head injury hitting head in chair x 1 hr ago denies LOC

## 2014-04-29 NOTE — ED Provider Notes (Signed)
CSN: [id number]     Arrival date & time 04/29/14  1636 History   First MD Initiated Contact with Patient 04/29/14 1714     Chief Complaint  Patient presents with  . Head Injury     (Consider location/radiation/quality/duration/timing/severity/associated sxs/prior Treatment) Patient is a 23 y.o. female presenting with head injury. The history is provided by the patient.  Head Injury Time since incident:  1 hour Mechanism of injury: fall   Pain details:    Quality:  Aching   Severity:  Mild   Duration:  1 hour   Timing:  Constant   Progression:  Unchanged Chronicity:  New Relieved by:  Nothing Worsened by:  Nothing tried Associated symptoms: nausea   Associated symptoms: no vomiting     History reviewed. No pertinent past medical history. Past Surgical History  Procedure Laterality Date  . Appendectomy     History reviewed. No pertinent family history. History  Substance Use Topics  . Smoking status: Current Every Day Smoker -- 0.50 packs/day    Types: Cigarettes  . Smokeless tobacco: Not on file  . Alcohol Use: Yes     Comment: occ   OB History   Grav Para Term Preterm Abortions TAB SAB Ect Mult Living                 Review of Systems  Constitutional: Negative for fever and chills.  Eyes: Negative for visual disturbance.  Respiratory: Negative for cough and shortness of breath.   Cardiovascular: Negative for chest pain and leg swelling.  Gastrointestinal: Positive for nausea. Negative for vomiting and abdominal pain.  Neurological: Positive for dizziness.  All other systems reviewed and are negative.     Allergies  Review of patient's allergies indicates no known allergies.  Home Medications   Prior to Admission medications   Medication Sig Start Date End Date Taking? Authorizing Provider  ondansetron (ZOFRAN ODT) 4 MG disintegrating tablet 4mg  ODT q4 hours prn nausea/vomit 04/29/14   Elwin MochaBlair Amma Crear, MD   BP 119/61  Pulse 94  Temp(Src) 98.2 F (36.8  C) (Oral)  Resp 16  Ht 5\' 4"  (1.626 m)  Wt 120 lb (54.432 kg)  BMI 20.59 kg/m2  SpO2 100%  LMP 04/22/2014 Physical Exam  Nursing note and vitals reviewed. Constitutional: She is oriented to person, place, and time. She appears well-developed and well-nourished. No distress.  HENT:  Head: Normocephalic.    Mouth/Throat: Oropharynx is clear and moist.  Eyes: EOM are normal. Pupils are equal, round, and reactive to light.  Neck: Normal range of motion. Neck supple.  Cardiovascular: Normal rate and regular rhythm.  Exam reveals no friction rub.   No murmur heard. Pulmonary/Chest: Effort normal and breath sounds normal. No respiratory distress. She has no wheezes. She has no rales.  Abdominal: Soft. She exhibits no distension. There is no tenderness. There is no rebound.  Musculoskeletal: Normal range of motion. She exhibits no edema.       Cervical back: She exhibits no tenderness and no bony tenderness.       Thoracic back: She exhibits no tenderness and no bony tenderness.  Neurological: She is alert and oriented to person, place, and time.  Skin: She is not diaphoretic.    ED Course  Procedures (including critical care time) Labs Review Labs Reviewed - No data to display  Imaging Review No results found.   EKG Interpretation None      MDM   Final diagnoses:  Head injury, initial encounter  58F presents with head injury. Fell in her brother's room, hit her head on a chair. No LOC, but some nausea. No vomiting. No fevers. No visual disturbance. Happened about one hour ago. Friend accompanying her states she's acting normal. AFVSS here. Small hematoma on occiput, normal neck exam, no bony tenderness. No hemotympanum, no raccoon's eyes. I discussed CT scanning with the patient. I feel low risk for intracranial injury, she would rather not get scanned at this time, so we will refrain from CT at this time. Given return precautions, zofran for nausea.     Elwin MochaBlair Rahaf Carbonell,  MD 04/29/14 [phone]

## 2014-04-29 NOTE — Discharge Instructions (Signed)
Concussion  A concussion, or closed-head injury, is a brain injury caused by a direct blow to the head or by a quick and sudden movement (jolt) of the head or neck. Concussions are usually not life-threatening. Even so, the effects of a concussion can be serious. If you have had a concussion before, you are more likely to experience concussion-like symptoms after a direct blow to the head.   CAUSES  · Direct blow to the head, such as from running into another player during a soccer game, being hit in a fight, or hitting your head on a hard surface.  · A jolt of the head or neck that causes the brain to move back and forth inside the skull, such as in a car crash.  SIGNS AND SYMPTOMS  The signs of a concussion can be hard to notice. Early on, they may be missed by you, family members, and health care providers. You may look fine but act or feel differently.  Symptoms are usually temporary, but they may last for days, weeks, or even longer. Some symptoms may appear right away while others may not show up for hours or days. Every head injury is different. Symptoms include:  · Mild to moderate headaches that will not go away.  · A feeling of pressure inside your head.  · Having more trouble than usual:  ¨ Learning or remembering things you have heard.  ¨ Answering questions.  ¨ Paying attention or concentrating.  ¨ Organizing daily tasks.  ¨ Making decisions and solving problems.  · Slowness in thinking, acting or reacting, speaking, or reading.  · Getting lost or being easily confused.  · Feeling tired all the time or lacking energy (fatigued).  · Feeling drowsy.  · Sleep disturbances.  ¨ Sleeping more than usual.  ¨ Sleeping less than usual.  ¨ Trouble falling asleep.  ¨ Trouble sleeping (insomnia).  · Loss of balance or feeling lightheaded or dizzy.  · Nausea or vomiting.  · Numbness or tingling.  · Increased sensitivity to:  ¨ Sounds.  ¨ Lights.  ¨ Distractions.  · Vision problems or eyes that tire  easily.  · Diminished sense of taste or smell.  · Ringing in the ears.  · Mood changes such as feeling sad or anxious.  · Becoming easily irritated or angry for little or no reason.  · Lack of motivation.  · Seeing or hearing things other people do not see or hear (hallucinations).  DIAGNOSIS  Your health care provider can usually diagnose a concussion based on a description of your injury and symptoms. He or she will ask whether you passed out (lost consciousness) and whether you are having trouble remembering events that happened right before and during your injury.  Your evaluation might include:  · A brain scan to look for signs of injury to the brain. Even if the test shows no injury, you may still have a concussion.  · Blood tests to be sure other problems are not present.  TREATMENT  · Concussions are usually treated in an emergency department, in urgent care, or at a clinic. You may need to stay in the hospital overnight for further treatment.  · Tell your health care provider if you are taking any medicines, including prescription medicines, over-the-counter medicines, and natural remedies. Some medicines, such as blood thinners (anticoagulants) and aspirin, may increase the chance of complications. Also tell your health care provider whether you have had alcohol or are taking illegal drugs. This information   may affect treatment.  · Your health care provider will send you home with important instructions to follow.  · How fast you will recover from a concussion depends on many factors. These factors include how severe your concussion is, what part of your brain was injured, your age, and how healthy you were before the concussion.  · Most people with mild injuries recover fully. Recovery can take time. In general, recovery is slower in older persons. Also, persons who have had a concussion in the past or have other medical problems may find that it takes longer to recover from their current injury.  HOME  CARE INSTRUCTIONS  General Instructions  · Carefully follow the directions your health care provider gave you.  · Only take over-the-counter or prescription medicines for pain, discomfort, or fever as directed by your health care provider.  · Take only those medicines that your health care provider has approved.  · Do not drink alcohol until your health care provider says you are well enough to do so. Alcohol and certain other drugs may slow your recovery and can put you at risk of further injury.  · If it is harder than usual to remember things, write them down.  · If you are easily distracted, try to do one thing at a time. For example, do not try to watch TV while fixing dinner.  · Talk with family members or close friends when making important decisions.  · Keep all follow-up appointments. Repeated evaluation of your symptoms is recommended for your recovery.  · Watch your symptoms and tell others to do the same. Complications sometimes occur after a concussion. Older adults with a brain injury may have a higher risk of serious complications, such as a blood clot on the brain.  · Tell your teachers, school nurse, school counselor, coach, athletic trainer, or work manager about your injury, symptoms, and restrictions. Tell them about what you can or cannot do. They should watch for:  ¨ Increased problems with attention or concentration.  ¨ Increased difficulty remembering or learning new information.  ¨ Increased time needed to complete tasks or assignments.  ¨ Increased irritability or decreased ability to cope with stress.  ¨ Increased symptoms.  · Rest. Rest helps the brain to heal. Make sure you:  ¨ Get plenty of sleep at night. Avoid staying up late at night.  ¨ Keep the same bedtime hours on weekends and weekdays.  ¨ Rest during the day. Take daytime naps or rest breaks when you feel tired.  · Limit activities that require a lot of thought or concentration. These include:  ¨ Doing homework or job-related  work.  ¨ Watching TV.  ¨ Working on the computer.  · Avoid any situation where there is potential for another head injury (football, hockey, soccer, basketball, martial arts, downhill snow sports and horseback riding). Your condition will get worse every time you experience a concussion. You should avoid these activities until you are evaluated by the appropriate follow-up health care providers.  Returning To Your Regular Activities  You will need to return to your normal activities slowly, not all at once. You must give your body and brain enough time for recovery.  · Do not return to sports or other athletic activities until your health care provider tells you it is safe to do so.  · Ask your health care provider when you can drive, ride a bicycle, or operate heavy machinery. Your ability to react may be slower after a   brain injury. Never do these activities if you are dizzy.  · Ask your health care provider about when you can return to work or school.  Preventing Another Concussion  It is very important to avoid another brain injury, especially before you have recovered. In rare cases, another injury can lead to permanent brain damage, brain swelling, or death. The risk of this is greatest during the first 7-10 days after a head injury. Avoid injuries by:  · Wearing a seat belt when riding in a car.  · Drinking alcohol only in moderation.  · Wearing a helmet when biking, skiing, skateboarding, skating, or doing similar activities.  · Avoiding activities that could lead to a second concussion, such as contact or recreational sports, until your health care provider says it is okay.  · Taking safety measures in your home.  ¨ Remove clutter and tripping hazards from floors and stairways.  ¨ Use grab bars in bathrooms and handrails by stairs.  ¨ Place non-slip mats on floors and in bathtubs.  ¨ Improve lighting in dim areas.  SEEK MEDICAL CARE IF:  · You have increased problems paying attention or  concentrating.  · You have increased difficulty remembering or learning new information.  · You need more time to complete tasks or assignments than before.  · You have increased irritability or decreased ability to cope with stress.  · You have more symptoms than before.  Seek medical care if you have any of the following symptoms for more than 2 weeks after your injury:  · Lasting (chronic) headaches.  · Dizziness or balance problems.  · Nausea.  · Vision problems.  · Increased sensitivity to noise or light.  · Depression or mood swings.  · Anxiety or irritability.  · Memory problems.  · Difficulty concentrating or paying attention.  · Sleep problems.  · Feeling tired all the time.  SEEK IMMEDIATE MEDICAL CARE IF:  · You have severe or worsening headaches. These may be a sign of a blood clot in the brain.  · You have weakness (even if only in one hand, leg, or part of the face).  · You have numbness.  · You have decreased coordination.  · You vomit repeatedly.  · You have increased sleepiness.  · One pupil is larger than the other.  · You have convulsions.  · You have slurred speech.  · You have increased confusion. This may be a sign of a blood clot in the brain.  · You have increased restlessness, agitation, or irritability.  · You are unable to recognize people or places.  · You have neck pain.  · It is difficult to wake you up.  · You have unusual behavior changes.  · You lose consciousness.  MAKE SURE YOU:  · Understand these instructions.  · Will watch your condition.  · Will get help right away if you are not doing well or get worse.  Document Released: 09/17/2003 Document Revised: 07/02/2013 Document Reviewed: 01/17/2013  [employer]® Patient Information ©2015 [employer]. This information is not intended to replace advice given to you by your health care provider. Make sure you discuss any questions you have with your health care provider.

## 2014-08-08 ENCOUNTER — Emergency Department (HOSPITAL_BASED_OUTPATIENT_CLINIC_OR_DEPARTMENT_OTHER)
Admission: EM | Admit: 2014-08-08 | Discharge: 2014-08-08 | Disposition: A | Discharge status: Home / Self Care | Payer: No Typology Code available for payment source | Attending: Emergency Medicine | Admitting: Emergency Medicine

## 2014-08-08 ENCOUNTER — Encounter (HOSPITAL_BASED_OUTPATIENT_CLINIC_OR_DEPARTMENT_OTHER): Payer: Self-pay

## 2014-08-08 DIAGNOSIS — Z720 Tobacco use: Secondary | ICD-10-CM | POA: Insufficient documentation

## 2014-08-08 DIAGNOSIS — Z3202 Encounter for pregnancy test, result negative: Secondary | ICD-10-CM | POA: Insufficient documentation

## 2014-08-08 DIAGNOSIS — R109 Unspecified abdominal pain: Secondary | ICD-10-CM

## 2014-08-08 DIAGNOSIS — N390 Urinary tract infection, site not specified: Secondary | ICD-10-CM | POA: Insufficient documentation

## 2014-08-08 DIAGNOSIS — N898 Other specified noninflammatory disorders of vagina: Secondary | ICD-10-CM | POA: Insufficient documentation

## 2014-08-08 LAB — URINALYSIS, ROUTINE W REFLEX MICROSCOPIC
BILIRUBIN URINE: NEGATIVE
Glucose, UA: NEGATIVE mg/dL
KETONES UR: NEGATIVE mg/dL
NITRITE: NEGATIVE
PH: 6 (ref 5.0–8.0)
Protein, ur: NEGATIVE mg/dL
Specific Gravity, Urine: 1.024 (ref 1.005–1.030)
Urobilinogen, UA: 0.2 mg/dL (ref 0.0–1.0)

## 2014-08-08 LAB — URINE MICROSCOPIC-ADD ON

## 2014-08-08 LAB — WET PREP, GENITAL
Trich, Wet Prep: NONE SEEN
YEAST WET PREP: NONE SEEN

## 2014-08-08 LAB — PREGNANCY, URINE: PREG TEST UR: NEGATIVE

## 2014-08-08 MED ORDER — CEFTRIAXONE SODIUM 250 MG IJ SOLR
250.0000 mg | Freq: Once | INTRAMUSCULAR | Status: AC
  Administered 2014-08-08: 250 mg via INTRAMUSCULAR
  Filled 2014-08-08: qty 250

## 2014-08-08 MED ORDER — PHENAZOPYRIDINE HCL 200 MG PO TABS: 200.0000 mg | ORAL_TABLET | Freq: Three times a day (TID) | ORAL | Status: DC

## 2014-08-08 MED ORDER — LIDOCAINE HCL (PF) 1 % IJ SOLN
INTRAMUSCULAR | Status: AC
  Administered 2014-08-08: 0.9 mL
  Filled 2014-08-08: qty 5

## 2014-08-08 MED ORDER — CEPHALEXIN 500 MG PO CAPS: 500.0000 mg | ORAL_CAPSULE | Freq: Four times a day (QID) | ORAL | Status: DC

## 2014-08-08 MED ORDER — AZITHROMYCIN 250 MG PO TABS
1000.0000 mg | ORAL_TABLET | Freq: Once | ORAL | Status: AC
  Administered 2014-08-08: 1000 mg via ORAL
  Filled 2014-08-08: qty 4

## 2014-08-08 NOTE — ED Notes (Signed)
Pt reports left flank pain, urinary urgency, LLQ pain, pt wants to "check for yeast infection" also - s/s x1 week.

## 2014-08-08 NOTE — Discharge Instructions (Signed)
Take Keflex as prescribed. It is important for you to complete the entire course of this antibiotic. Pyridium is her burning with urination. You were treated today for both gonorrhea and chlamydia as prophylaxis for the vaginal discharge. If these tests result positive, you will be contacted and are then obligated to inform your partner for treatment.  Flank Pain Flank pain refers to pain that is located on the side of the body between the upper abdomen and the back. The pain may occur over a short period of time (acute) or may be long-term or reoccurring (chronic). It may be mild or severe. Flank pain can be caused by many things. CAUSES  Some of the more common causes of flank pain include:  Muscle strains.   Muscle spasms.   A disease of your spine (vertebral disk disease).   A lung infection (pneumonia).   Fluid around your lungs (pulmonary edema).   A kidney infection.   Kidney stones.   A very painful skin rash caused by the chickenpox virus (shingles).   Gallbladder disease.  HOME CARE INSTRUCTIONS  Home care will depend on the cause of your pain. In general,  Rest as directed by your caregiver.  Drink enough fluids to keep your urine clear or pale yellow.  Only take over-the-counter or prescription medicines as directed by your caregiver. Some medicines may help relieve the pain.  Tell your caregiver about any changes in your pain.  Follow up with your caregiver as directed. SEEK IMMEDIATE MEDICAL CARE IF:   Your pain is not controlled with medicine.   You have new or worsening symptoms.  Your pain increases.   You have abdominal pain.   You have shortness of breath.   You have persistent nausea or vomiting.   You have swelling in your abdomen.   You feel faint or pass out.   You have blood in your urine.  You have a fever or persistent symptoms for more than 2-3 days.  You have a fever and your symptoms suddenly get worse. MAKE SURE  YOU:   Understand these instructions.  Will watch your condition.  Will get help right away if you are not doing well or get worse. Document Released: 08/18/2005 Document Revised: 03/21/2012 Document Reviewed: 02/09/2012 [facility] Patient Information 2015 [place], [place]. This information is not intended to replace advice given to you by your health care provider. Make sure you discuss any questions you have with your health care provider.  Urinary Tract Infection Urinary tract infections (UTIs) can develop anywhere along your urinary tract. Your urinary tract is your body's drainage system for removing wastes and extra water. Your urinary tract includes two kidneys, two ureters, a bladder, and a urethra. Your kidneys are a pair of bean-shaped organs. Each kidney is about the size of your fist. They are located below your ribs, one on each side of your spine. CAUSES Infections are caused by microbes, which are microscopic organisms, including fungi, viruses, and bacteria. These organisms are so small that they can only be seen through a microscope. Bacteria are the microbes that most commonly cause UTIs. SYMPTOMS  Symptoms of UTIs may vary by age and gender of the patient and by the location of the infection. Symptoms in young women typically include a frequent and intense urge to urinate and a painful, burning feeling in the bladder or urethra during urination. Older women and men are more likely to be tired, shaky, and weak and have muscle aches and abdominal pain. A  fever may mean the infection is in your kidneys. Other symptoms of a kidney infection include pain in your back or sides below the ribs, nausea, and vomiting. DIAGNOSIS To diagnose a UTI, your caregiver will ask you about your symptoms. Your caregiver also will ask to provide a urine sample. The urine sample will be tested for bacteria and white blood cells. White blood cells are made by your body to help fight infection. TREATMENT    Typically, UTIs can be treated with medication. Because most UTIs are caused by a bacterial infection, they usually can be treated with the use of antibiotics. The choice of antibiotic and length of treatment depend on your symptoms and the type of bacteria causing your infection. HOME CARE INSTRUCTIONS  If you were prescribed antibiotics, take them exactly as your caregiver instructs you. Finish the medication even if you feel better after you have only taken some of the medication.  Drink enough water and fluids to keep your urine clear or pale yellow.  Avoid caffeine, tea, and carbonated beverages. They tend to irritate your bladder.  Empty your bladder often. Avoid holding urine for long periods of time.  Empty your bladder before and after sexual intercourse.  After a bowel movement, women should cleanse from front to back. Use each tissue only once. SEEK MEDICAL CARE IF:   You have back pain.  You develop a fever.  Your symptoms do not begin to resolve within 3 days. SEEK IMMEDIATE MEDICAL CARE IF:   You have severe back pain or lower abdominal pain.  You develop chills.  You have nausea or vomiting.  You have continued burning or discomfort with urination. MAKE SURE YOU:   Understand these instructions.  Will watch your condition.  Will get help right away if you are not doing well or get worse. Document Released: 04/06/2005 Document Revised: 12/27/2011 Document Reviewed: 08/05/2011 [facility] Patient Information 2015 [place]. This information is not intended to replace advice given to you by your health care provider. Make sure you discuss any questions you have with your health care provider.

## 2014-08-08 NOTE — ED Provider Notes (Signed)
CSN: [id number]     Arrival date & time 08/08/14  1640 History   First MD Initiated Contact with Patient 08/08/14 1658     Chief Complaint  Patient presents with  . Flank Pain     (Consider location/radiation/quality/duration/timing/severity/associated sxs/prior Treatment) HPI Comments: 24 year old female presenting to the ED complaining of left-sided flank pain 4 days. Patient reports the pain began on the left side of her back and is radiating towards her left flank. States this feels similar to when she had a urinary tract infection in the past. Admits to urinary urgency without increased frequency or dysuria. Denies hematuria or vaginal bleeding. States she has a small amount of weight vaginal discharge. Denies vaginal or pelvic pain. She is sexually active with one partner that she has been monogamous with for the past 2 years. States she had trichomonas about 2 years ago, no other history of sexually transmitted diseases. Denies fever, chills, nausea or vomiting.  Patient is a 24 y.o. female presenting with flank pain. The history is provided by the patient.  Flank Pain    History reviewed. No pertinent past medical history. Past Surgical History  Procedure Laterality Date  . Appendectomy     History reviewed. No pertinent family history. History  Substance Use Topics  . Smoking status: Current Every Day Smoker -- 0.50 packs/day    Types: Cigarettes  . Smokeless tobacco: Not on file  . Alcohol Use: Yes     Comment: occ   OB History    No data available     Review of Systems  Genitourinary: Positive for urgency, flank pain and vaginal discharge.  All other systems reviewed and are negative.     Allergies  Review of patient's allergies indicates no known allergies.  Home Medications   Prior to Admission medications   Medication Sig Start Date End Date Taking? Authorizing Provider  cephALEXin (KEFLEX) 500 MG capsule Take 1 capsule (500 mg total) by mouth 4 (four)  times daily. 08/08/14   Kathrynn Speed, PA-C  ondansetron (ZOFRAN ODT) 4 MG disintegrating tablet  ODT q4 hours prn nausea/vomit 04/29/14   Elwin Mocha, MD  phenazopyridine (PYRIDIUM) 200 MG tablet Take 1 tablet (200 mg total) by mouth 3 (three) times daily. 08/08/14   Lysandra Loughmiller M April Carlyon, PA-C   BP 113/63 mmHg  Pulse 77  Temp(Src) 99.3 F (37.4 C) (Oral)  Resp 18  Ht  (1.626 m)  Wt 140 lb (63.504 kg)  BMI 24.02 kg/m2  SpO2 100%  LMP 08/01/2014 Physical Exam  Constitutional: She is oriented to person, place, and time. She appears well-developed and well-nourished. No distress.  HENT:  Head: Normocephalic and atraumatic.  Mouth/Throat: Oropharynx is clear and moist.  Eyes: Conjunctivae and EOM are normal.  Neck: Normal range of motion. Neck supple.  Cardiovascular: Normal rate, regular rhythm and normal heart sounds.   Pulmonary/Chest: Effort normal and breath sounds normal. No respiratory distress.  Abdominal: Soft. Normal appearance and bowel sounds are normal. There is tenderness (mild).    No CVAT.  Genitourinary: Uterus normal. Cervix exhibits no motion tenderness, no discharge and no friability. Right adnexum displays no tenderness. Left adnexum displays no tenderness. No tenderness or bleeding in the vagina. Vaginal discharge (white) found.  Musculoskeletal: Normal range of motion. She exhibits no edema.  Neurological: She is alert and oriented to person, place, and time. No sensory deficit.  Skin: Skin is warm and dry.  Psychiatric: She has a normal mood and affect. Her behavior  is normal.  Nursing note and vitals reviewed.   ED Course  Procedures (including critical care time) Labs Review Labs Reviewed  WET PREP, GENITAL - Abnormal; Notable for the following:    Clue Cells Wet Prep HPF POC FEW (*)    WBC, Wet Prep HPF POC MANY (*)    All other components within normal limits  URINALYSIS, ROUTINE W REFLEX MICROSCOPIC - Abnormal; Notable for the following:     APPearance TURBID (*)    Hgb urine dipstick MODERATE (*)    Leukocytes, UA MODERATE (*)    All other components within normal limits  URINE MICROSCOPIC-ADD ON - Abnormal; Notable for the following:    Squamous Epithelial / LPF FEW (*)    Bacteria, UA FEW (*)    All other components within normal limits  PREGNANCY, URINE  GC/CHLAMYDIA PROBE AMP ([place])    Imaging Review No results found.   EKG Interpretation None      MDM   Final diagnoses:  Acute UTI  Vaginal discharge  Flank pain   Patient in no apparent distress. Tachycardic on arrival, however during my examination she was no longer tachycardic. Heart rate between 70 and 80. Abdomen is soft with mild left-sided tenderness. No peritoneal signs. On pelvic exam, no cervical motion tenderness or adnexal tenderness. White vaginal discharge noted. Wet prep positive for many white blood cells, only few clue cells. Urinalysis positive for infection. Treat with Keflex. Patient agreeable to prophylactic treatment for GC/chlamydia with Rocephin and azithromycin. Stable for discharge. F/u with PCP. Return precautions given. Patient states understanding of treatment care plan and is agreeable.   Kathrynn SpeedRobyn M Conchetta Lamia, PA-C 08/08/14 1805  Glynn OctaveStephen Rancour, MD 08/09/14 [phone]

## 2014-08-11 LAB — GC/CHLAMYDIA PROBE AMP (~~LOC~~) NOT AT ARMC
Chlamydia: NEGATIVE
Neisseria Gonorrhea: NEGATIVE

## 2014-10-21 ENCOUNTER — Emergency Department (HOSPITAL_BASED_OUTPATIENT_CLINIC_OR_DEPARTMENT_OTHER)
Admission: EM | Admit: 2014-10-21 | Discharge: 2014-10-21 | Disposition: A | Discharge status: Home / Self Care | Payer: No Typology Code available for payment source | Attending: Emergency Medicine | Admitting: Emergency Medicine

## 2014-10-21 ENCOUNTER — Encounter (HOSPITAL_BASED_OUTPATIENT_CLINIC_OR_DEPARTMENT_OTHER): Payer: Self-pay | Admitting: *Deleted

## 2014-10-21 DIAGNOSIS — L02213 Cutaneous abscess of chest wall: Secondary | ICD-10-CM | POA: Diagnosis not present

## 2014-10-21 DIAGNOSIS — O99711 Diseases of the skin and subcutaneous tissue complicating pregnancy, first trimester: Secondary | ICD-10-CM | POA: Insufficient documentation

## 2014-10-21 DIAGNOSIS — Z79899 Other long term (current) drug therapy: Secondary | ICD-10-CM | POA: Diagnosis not present

## 2014-10-21 DIAGNOSIS — Z3490 Encounter for supervision of normal pregnancy, unspecified, unspecified trimester: Secondary | ICD-10-CM

## 2014-10-21 DIAGNOSIS — Z792 Long term (current) use of antibiotics: Secondary | ICD-10-CM | POA: Diagnosis not present

## 2014-10-21 DIAGNOSIS — Z3A01 Less than 8 weeks gestation of pregnancy: Secondary | ICD-10-CM | POA: Diagnosis not present

## 2014-10-21 DIAGNOSIS — F17210 Nicotine dependence, cigarettes, uncomplicated: Secondary | ICD-10-CM | POA: Insufficient documentation

## 2014-10-21 DIAGNOSIS — O99331 Smoking (tobacco) complicating pregnancy, first trimester: Secondary | ICD-10-CM | POA: Insufficient documentation

## 2014-10-21 LAB — PREGNANCY, URINE: Preg Test, Ur: POSITIVE — AB

## 2014-10-21 MED ORDER — PRENATAL COMPLETE 14-0.4 MG PO TABS: 14.0000 mg | ORAL_TABLET | Freq: Every day | ORAL | Status: DC

## 2014-10-21 MED ORDER — LIDOCAINE-EPINEPHRINE (PF) 2 %-1:200000 IJ SOLN
10.0000 mL | Freq: Once | INTRAMUSCULAR | Status: DC
  Filled 2014-10-21: qty 10

## 2014-10-21 NOTE — ED Provider Notes (Signed)
CSN: [id number]     Arrival date & time 10/21/14  1159 History   First MD Initiated Contact with Patient 10/21/14 1221     Chief Complaint  Patient presents with  . Abscess     (Consider location/radiation/quality/duration/timing/severity/associated sxs/prior Treatment) HPI Comments: 24 year old [id number]  female presenting with an abscess on her mid chest 1 week, increasing in size. States the area is red, warm and painful. No drainage. States she had a similar abscess on her back in the past that required drainage. Denies fever or chills. She is also wondering how long she may have been pregnant before. States last menstrual period was in the middle of February, she took a pregnancy test which was positive. Denies abdominal pain or vaginal bleeding. She was pregnant once in 2013 and had a spontaneous abortion at 5 weeks, and in 2014 had a D&C around 5 months.  Patient is a 24 y.o. female presenting with abscess. The history is provided by the patient.  Abscess   History reviewed. No pertinent past medical history. Past Surgical History  Procedure Laterality Date  . Appendectomy     No family history on file. History  Substance Use Topics  . Smoking status: Current Every Day Smoker -- 0.50 packs/day    Types: Cigarettes  . Smokeless tobacco: Not on file  . Alcohol Use: Yes     Comment: occ   OB History    No data available     Review of Systems  Skin: Positive for wound.  All other systems reviewed and are negative.     Allergies  Review of patient's allergies indicates no known allergies.  Home Medications   Prior to Admission medications   Medication Sig Start Date End Date Taking? Authorizing Provider  cephALEXin (KEFLEX) 500 MG capsule Take 1 capsule (500 mg total) by mouth 4 (four) times daily. 08/08/14   Kathrynn Speed, PA-C  ondansetron (ZOFRAN ODT) 4 MG disintegrating tablet  ODT q4 hours prn nausea/vomit 04/29/14   Elwin Mocha, MD  phenazopyridine  (PYRIDIUM) 200 MG tablet Take 1 tablet (200 mg total) by mouth 3 (three) times daily. 08/08/14   Kathrynn Speed, PA-C  Prenatal Vit-Fe Fumarate-FA (PRENATAL COMPLETE) 14-0.4 MG TABS Take 14 mg by mouth daily. 10/21/14   Troy Kanouse M Dionne Knoop, PA-C   BP 119/58 mmHg  Pulse 82  Temp(Src) 98.3 F (36.8 C) (Oral)  Resp 20  Ht  (1.626 m)  Wt 132 lb (59.875 kg)  BMI 22.65 kg/m2  SpO2 100%  LMP 09/20/2014 Physical Exam  Constitutional: She is oriented to person, place, and time. She appears well-developed and well-nourished. No distress.  HENT:  Head: Normocephalic and atraumatic.  Mouth/Throat: Oropharynx is clear and moist.  Eyes: Conjunctivae and EOM are normal.  Neck: Normal range of motion. Neck supple.  Cardiovascular: Normal rate, regular rhythm and normal heart sounds.   Pulmonary/Chest: Effort normal and breath sounds normal. No respiratory distress.    Abdominal: Normal appearance. She exhibits no distension. There is no tenderness.  Musculoskeletal: Normal range of motion. She exhibits no edema.  Neurological: She is alert and oriented to person, place, and time. No sensory deficit.  Skin: Skin is warm and dry.  Psychiatric: She has a normal mood and affect. Her behavior is normal.  Nursing note and vitals reviewed.   ED Course  Procedures (including critical care time) INCISION AND DRAINAGE Performed by: Celene Skeen Consent: Verbal consent obtained. Risks and benefits: risks, benefits and alternatives were discussed  Type: abscess  Body area: mid chest  Anesthesia: local infiltration  Incision was made with a scalpel.  Local anesthetic: lidocaine 2% with epinephrine  Anesthetic total: 1.5 ml  Complexity: complex Blunt dissection to break up loculations  Drainage: purulent  Drainage amount: large  Packing material: none  Patient tolerance: Patient tolerated the procedure well with no immediate complications.  Labs Review Labs Reviewed  PREGNANCY, URINE -  Abnormal; Notable for the following:    Preg Test, Ur POSITIVE (*)    All other components within normal limits    Imaging Review No results found.   EKG Interpretation None      MDM   Final diagnoses:  Chest wall abscess  Pregnant   Nontoxic appearing, NAD. Afebrile, VSS. Abscess drained. No surrounding cellulitis. Urine pregnancy positive. No associated abdominal pain or vaginal bleeding. Will start patient on prenatal vitamins, and discussed importance of follow-up with OB/GYN. Stable for discharge. Return precautions given. Patient states understanding of treatment care plan and is agreeable.  Kathrynn SpeedRobyn M Philip Eckersley, PA-C 10/21/14 1351  Pricilla LovelessScott Goldston, MD 10/23/14 [phone]

## 2014-10-21 NOTE — Discharge Instructions (Signed)
Take prenatal vitamins daily. Follow-up with OB/GYN as soon as possible. Apply warm compresses to your abscess intermittently throughout the day. Change the dressing twice daily.  Abscess Care After An abscess (also called a boil or furuncle) is an infected area that contains a collection of pus. Signs and symptoms of an abscess include pain, tenderness, redness, or hardness, or you may feel a moveable soft area under your skin. An abscess can occur anywhere in the body. The infection may spread to surrounding tissues causing cellulitis. A cut (incision) by the surgeon was made over your abscess and the pus was drained out. Gauze may have been packed into the space to provide a drain that will allow the cavity to heal from the inside outwards. The boil may be painful for 5 to 7 days. Most people with a boil do not have high fevers. Your abscess, if seen early, may not have localized, and may not have been lanced. If not, another appointment may be required for this if it does not get better on its own or with medications. HOME CARE INSTRUCTIONS   Only take over-the-counter or prescription medicines for pain, discomfort, or fever as directed by your caregiver.  When you bathe, soak and then remove gauze or iodoform packs at least daily or as directed by your caregiver. You may then wash the wound gently with mild soapy water. Repack with gauze or do as your caregiver directs. SEEK IMMEDIATE MEDICAL CARE IF:   You develop increased pain, swelling, redness, drainage, or bleeding in the wound site.  You develop signs of generalized infection including muscle aches, chills, fever, or a general ill feeling.  An oral temperature above 102 F (38.9 C) develops, not controlled by medication. See your caregiver for a recheck if you develop any of the symptoms described above. If medications (antibiotics) were prescribed, take them as directed. Document Released: 01/13/2005 Document Revised: 09/19/2011  Document Reviewed: 09/10/2007 [facility] Patient Information 2015 [place], [place]. This information is not intended to replace advice given to you by your health care provider. Make sure you discuss any questions you have with your health care provider.  Abscess An abscess is an infected area that contains a collection of pus and debris.It can occur in almost any part of the body. An abscess is also known as a furuncle or boil. CAUSES  An abscess occurs when tissue gets infected. This can occur from blockage of oil or sweat glands, infection of hair follicles, or a minor injury to the skin. As the body tries to fight the infection, pus collects in the area and creates pressure under the skin. This pressure causes pain. People with weakened immune systems have difficulty fighting infections and get certain abscesses more often.  SYMPTOMS Usually an abscess develops on the skin and becomes a painful mass that is red, warm, and tender. If the abscess forms under the skin, you may feel a moveable soft area under the skin. Some abscesses break open (rupture) on their own, but most will continue to get worse without care. The infection can spread deeper into the body and eventually into the bloodstream, causing you to feel ill.  DIAGNOSIS  Your caregiver will take your medical history and perform a physical exam. A sample of fluid may also be taken from the abscess to determine what is causing your infection. TREATMENT  Your caregiver may prescribe antibiotic medicines to fight the infection. However, taking antibiotics alone usually does not cure an abscess. Your caregiver may need  to make a small cut (incision) in the abscess to drain the pus. In some cases, gauze is packed into the abscess to reduce pain and to continue draining the area. HOME CARE INSTRUCTIONS   Only take over-the-counter or prescription medicines for pain, discomfort, or fever as directed by your caregiver.  If you were prescribed  antibiotics, take them as directed. Finish them even if you start to feel better.  If gauze is used, follow your caregiver's directions for changing the gauze.  To avoid spreading the infection:  Keep your draining abscess covered with a bandage.  Wash your hands well.  Do not share personal care items, towels, or whirlpools with others.  Avoid skin contact with others.  Keep your skin and clothes clean around the abscess.  Keep all follow-up appointments as directed by your caregiver. SEEK MEDICAL CARE IF:   You have increased pain, swelling, redness, fluid drainage, or bleeding.  You have muscle aches, chills, or a general ill feeling.  You have a fever. MAKE SURE YOU:   Understand these instructions.  Will watch your condition.  Will get help right away if you are not doing well or get worse. Document Released: 04/06/2005 Document Revised: 12/27/2011 Document Reviewed: 09/09/2011 [facility] Patient Information 2015 [place], [place]. This information is not intended to replace advice given to you by your health care provider. Make sure you discuss any questions you have with your health care provider.  First Trimester of Pregnancy The first trimester of pregnancy is from week 1 until the end of week 12 (months 1 through 3). A week after a sperm fertilizes an egg, the egg will implant on the wall of the uterus. This embryo will begin to develop into a baby. Genes from you and your partner are forming the baby. The female genes determine whether the baby is a boy or a girl. At 6-8 weeks, the eyes and face are formed, and the heartbeat can be seen on ultrasound. At the end of 12 weeks, all the baby's organs are formed.  Now that you are pregnant, you will want to do everything you can to have a healthy baby. Two of the most important things are to get good prenatal care and to follow your health care provider's instructions. Prenatal care is all the medical care you receive before the  baby's birth. This care will help prevent, find, and treat any problems during the pregnancy and childbirth. BODY CHANGES Your body goes through many changes during pregnancy. The changes vary from woman to woman.   You may gain or lose a couple of pounds at first.  You may feel sick to your stomach (nauseous) and throw up (vomit). If the vomiting is uncontrollable, call your health care provider.  You may tire easily.  You may develop headaches that can be relieved by medicines approved by your health care provider.  You may urinate more often. Painful urination may mean you have a bladder infection.  You may develop heartburn as a result of your pregnancy.  You may develop constipation because certain hormones are causing the muscles that push waste through your intestines to slow down.  You may develop hemorrhoids or swollen, bulging veins (varicose veins).  Your breasts may begin to grow larger and become tender. Your nipples may stick out more, and the tissue that surrounds them (areola) may become darker.  Your gums may bleed and may be sensitive to brushing and flossing.  Dark spots or blotches (chloasma, mask of pregnancy)  may develop on your face. This will likely fade after the baby is born.  Your menstrual periods will stop.  You may have a loss of appetite.  You may develop cravings for certain kinds of food.  You may have changes in your emotions from day to day, such as being excited to be pregnant or being concerned that something may go wrong with the pregnancy and baby.  You may have more vivid and strange dreams.  You may have changes in your hair. These can include thickening of your hair, rapid growth, and changes in texture. Some women also have hair loss during or after pregnancy, or hair that feels dry or thin. Your hair will most likely return to normal after your baby is born. WHAT TO EXPECT AT YOUR PRENATAL VISITS During a routine prenatal visit:  You  will be weighed to make sure you and the baby are growing normally.  Your blood pressure will be taken.  Your abdomen will be measured to track your baby's growth.  The fetal heartbeat will be listened to starting around week 10 or 12 of your pregnancy.  Test results from any previous visits will be discussed. Your health care provider may ask you:  How you are feeling.  If you are feeling the baby move.  If you have had any abnormal symptoms, such as leaking fluid, bleeding, severe headaches, or abdominal cramping.  If you have any questions. Other tests that may be performed during your first trimester include:  Blood tests to find your blood type and to check for the presence of any previous infections. They will also be used to check for low iron levels (anemia) and Rh antibodies. Later in the pregnancy, blood tests for diabetes will be done along with other tests if problems develop.  Urine tests to check for infections, diabetes, or protein in the urine.  An ultrasound to confirm the proper growth and development of the baby.  An amniocentesis to check for possible genetic problems.  Fetal screens for spina bifida and Down syndrome.  You may need other tests to make sure you and the baby are doing well. HOME CARE INSTRUCTIONS  Medicines  Follow your health care provider's instructions regarding medicine use. Specific medicines may be either safe or unsafe to take during pregnancy.  Take your prenatal vitamins as directed.  If you develop constipation, try taking a stool softener if your health care provider approves. Diet  Eat regular, well-balanced meals. Choose a variety of foods, such as meat or vegetable-based protein, fish, milk and low-fat dairy products, vegetables, fruits, and whole grain breads and cereals. Your health care provider will help you determine the amount of weight gain that is right for you.  Avoid raw meat and uncooked cheese. These carry germs  that can cause birth defects in the baby.  Eating four or five small meals rather than three large meals a day may help relieve nausea and vomiting. If you start to feel nauseous, eating a few soda crackers can be helpful. Drinking liquids between meals instead of during meals also seems to help nausea and vomiting.  If you develop constipation, eat more high-fiber foods, such as fresh vegetables or fruit and whole grains. Drink enough fluids to keep your urine clear or pale yellow. Activity and Exercise  Exercise only as directed by your health care provider. Exercising will help you:  Control your weight.  Stay in shape.  Be prepared for labor and delivery.  Experiencing pain  or cramping in the lower abdomen or low back is a good sign that you should stop exercising. Check with your health care provider before continuing normal exercises.  Try to avoid standing for long periods of time. Move your legs often if you must stand in one place for a long time.  Avoid heavy lifting.  Wear low-heeled shoes, and practice good posture.  You may continue to have sex unless your health care provider directs you otherwise. Relief of Pain or Discomfort  Wear a good support bra for breast tenderness.   Take warm sitz baths to soothe any pain or discomfort caused by hemorrhoids. Use hemorrhoid cream if your health care provider approves.   Rest with your legs elevated if you have leg cramps or low back pain.  If you develop varicose veins in your legs, wear support hose. Elevate your feet for 15 minutes, 3-4 times a day. Limit salt in your diet. Prenatal Care  Schedule your prenatal visits by the twelfth week of pregnancy. They are usually scheduled monthly at first, then more often in the last 2 months before delivery.  Write down your questions. Take them to your prenatal visits.  Keep all your prenatal visits as directed by your health care provider. Safety  Wear your seat belt at  all times when driving.  Make a list of emergency phone numbers, including numbers for family, friends, the hospital, and police and fire departments. General Tips  Ask your health care provider for a referral to a local prenatal education class. Begin classes no later than at the beginning of month 6 of your pregnancy.  Ask for help if you have counseling or nutritional needs during pregnancy. Your health care provider can offer advice or refer you to specialists for help with various needs.  Do not use hot tubs, steam rooms, or saunas.  Do not douche or use tampons or scented sanitary pads.  Do not cross your legs for long periods of time.  Avoid cat litter boxes and soil used by cats. These carry germs that can cause birth defects in the baby and possibly loss of the fetus by miscarriage or stillbirth.  Avoid all smoking, herbs, alcohol, and medicines not prescribed by your health care provider. Chemicals in these affect the formation and growth of the baby.  Schedule a dentist appointment. At home, brush your teeth with a soft toothbrush and be gentle when you floss. SEEK MEDICAL CARE IF:   You have dizziness.  You have mild pelvic cramps, pelvic pressure, or nagging pain in the abdominal area.  You have persistent nausea, vomiting, or diarrhea.  You have a bad smelling vaginal discharge.  You have pain with urination.  You notice increased swelling in your face, hands, legs, or ankles. SEEK IMMEDIATE MEDICAL CARE IF:   You have a fever.  You are leaking fluid from your vagina.  You have spotting or bleeding from your vagina.  You have severe abdominal cramping or pain.  You have rapid weight gain or loss.  You vomit blood or material that looks like coffee grounds.  You are exposed to [place] measles and have never had them.  You are exposed to fifth disease or chickenpox.  You develop a severe headache.  You have shortness of breath.  You have any kind of  trauma, such as from a fall or a car accident. Document Released: 06/21/2001 Document Revised: 11/11/2013 Document Reviewed: 05/07/2013 [facility] Patient Information 2015 [place], [place]. This information is not intended  to replace advice given to you by your health care provider. Make sure you discuss any questions you have with your health care provider. ° °

## 2014-10-21 NOTE — ED Notes (Signed)
Abscess between her breast for a week. Red, hot, and painful. She had had a positive pregnancy test.

## 2015-06-27 IMAGING — CT CT ABD-PELV W/O CM
2 of 4 series · 16 of 46 positions shown, 18 images · non-contrast
Comparison: None.

CLINICAL DATA: Right flank pain.

EXAM:
CT ABDOMEN AND PELVIS WITHOUT CONTRAST
TECHNIQUE: Multidetector CT imaging of the abdomen and pelvis was performed
following the standard protocol without intravenous contrast.

[Series 2: renal stone < 200 lbs 5.0 b31f · axial · 0.56mm/px · z∈[-464,-79]mm · 13 of 85 slices shown, 15 images]
[im 4/85  soft-tissue]
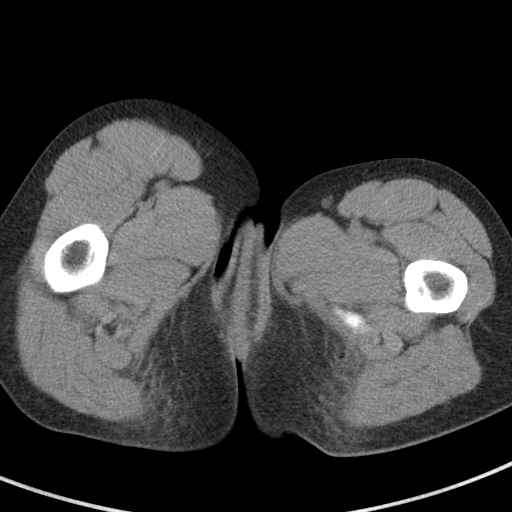
[im 4/85  bone]
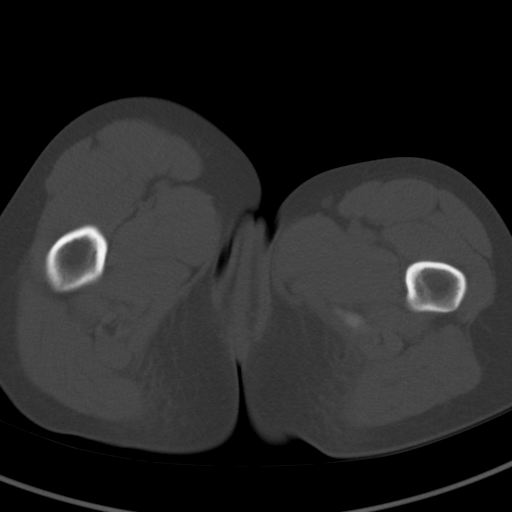
[im 11/85  soft-tissue]
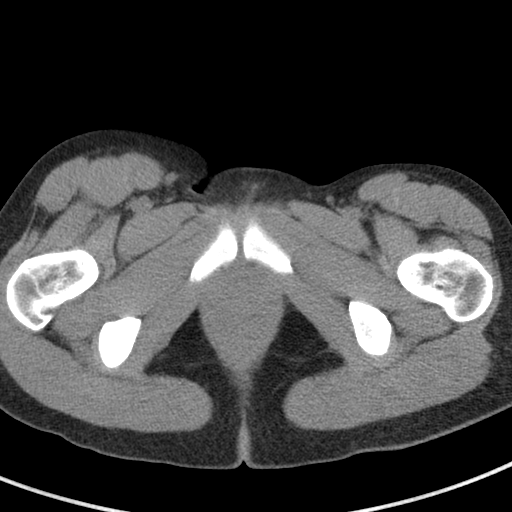
[im 18/85  soft-tissue]
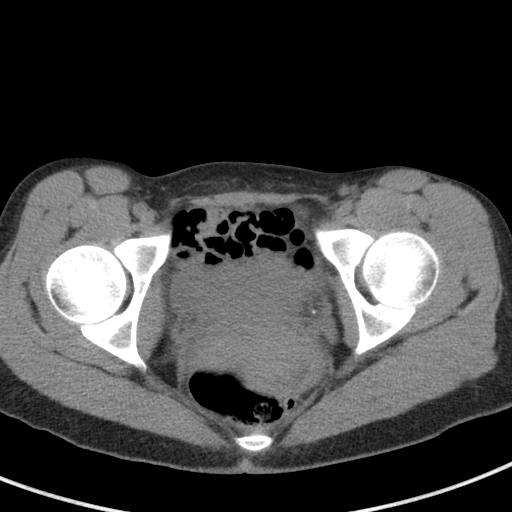
[im 25/85  soft-tissue]
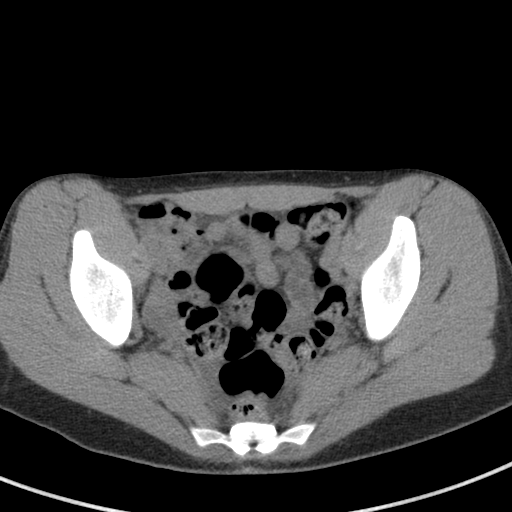
[im 29/85  soft-tissue]
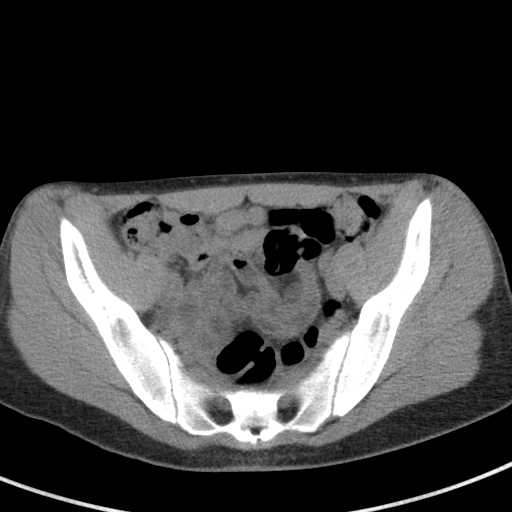
[im 36/85  soft-tissue]
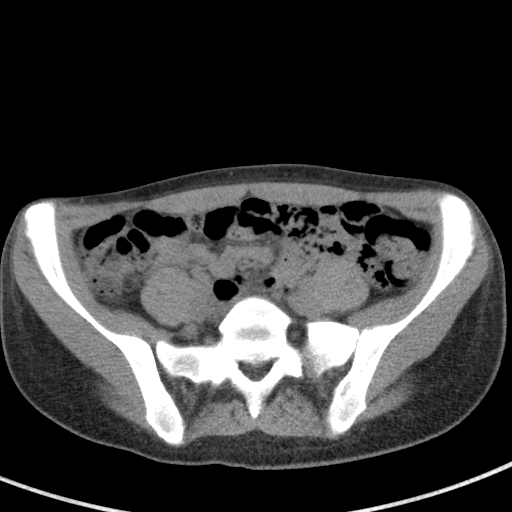
[im 43/85  soft-tissue]
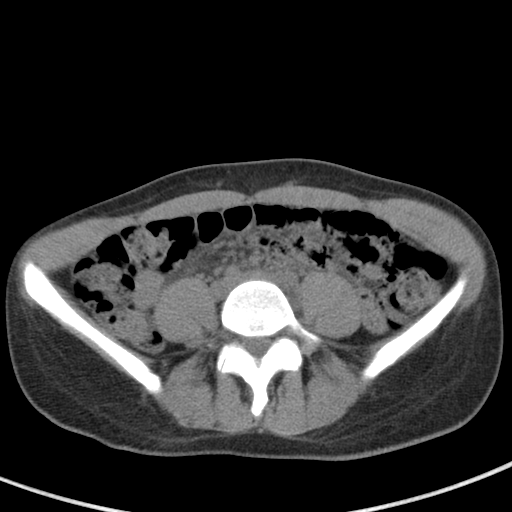
[im 50/85  soft-tissue]
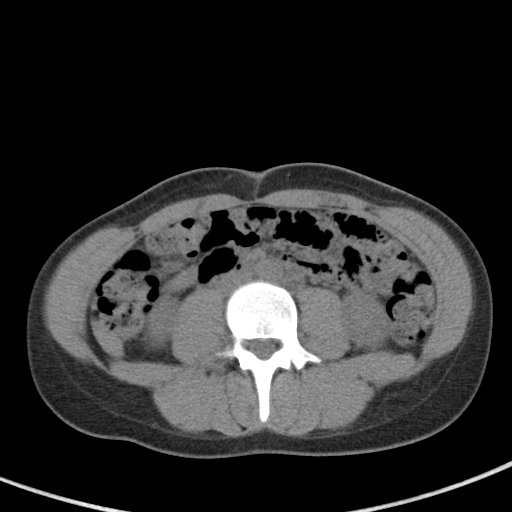
[im 57/85  soft-tissue]
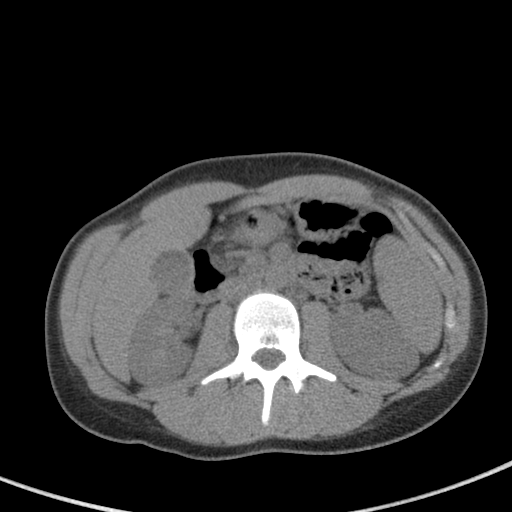
[im 57/85  bone]
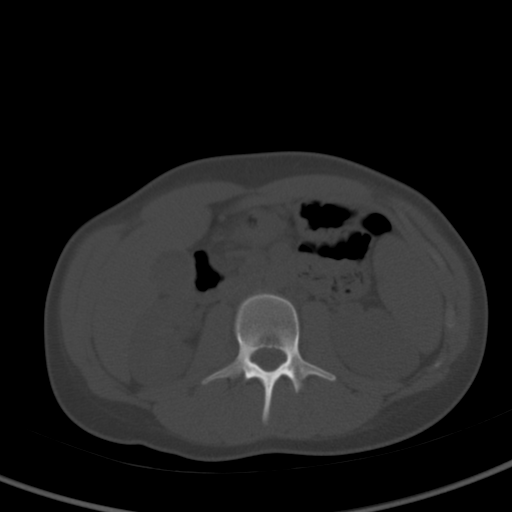
[im 60/85  soft-tissue]
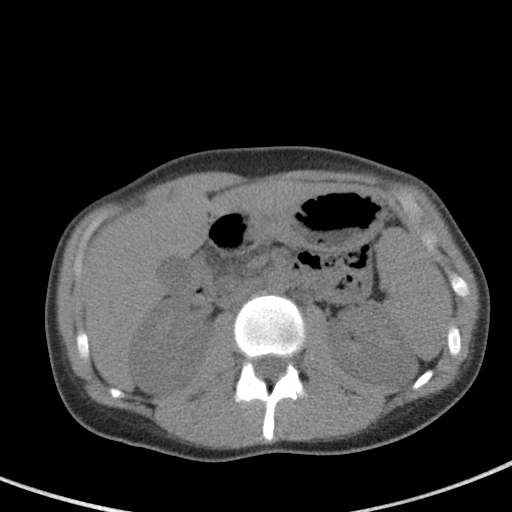
[im 67/85  soft-tissue]
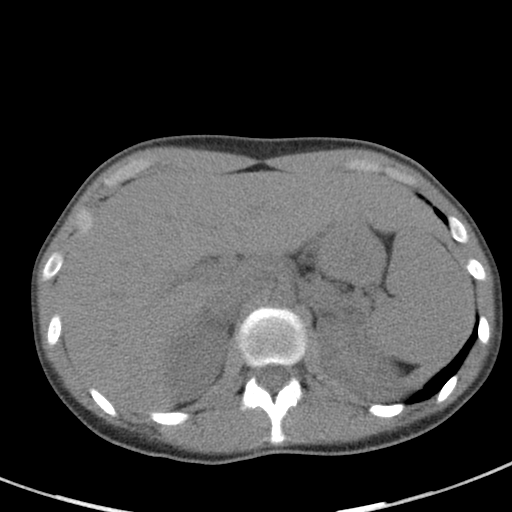
[im 74/85  soft-tissue]
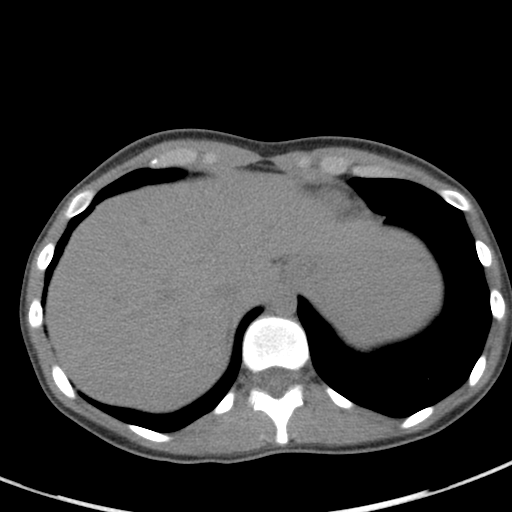
[im 81/85  soft-tissue]
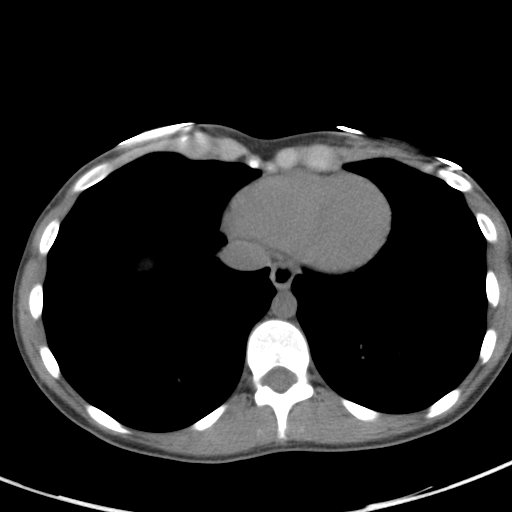

[Series 5: renal stone 3.0 coronal · coronal · 0.57mm/px · 3 of 63 slices shown]
[im 21/63  soft-tissue]
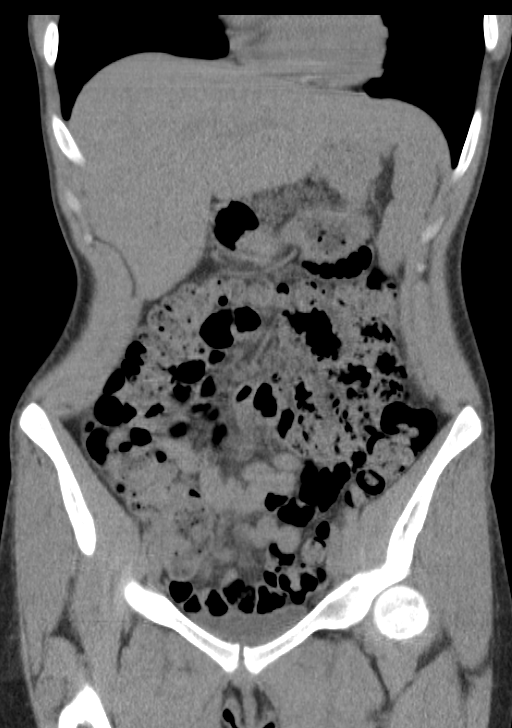
[im 28/63  soft-tissue]
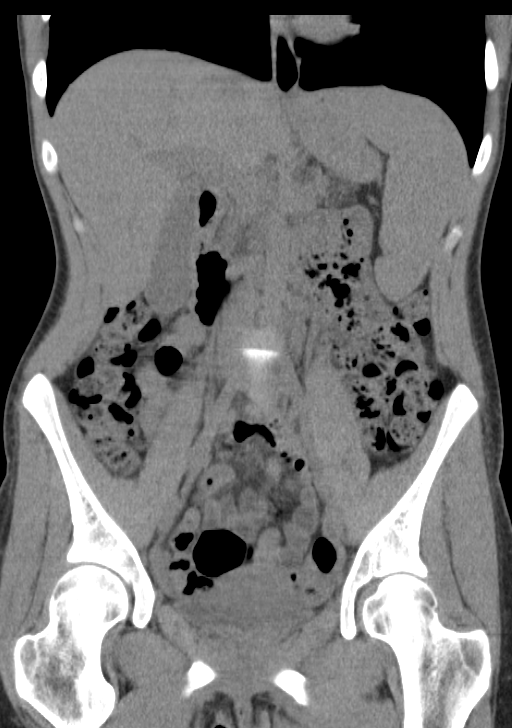
[im 35/63  soft-tissue]
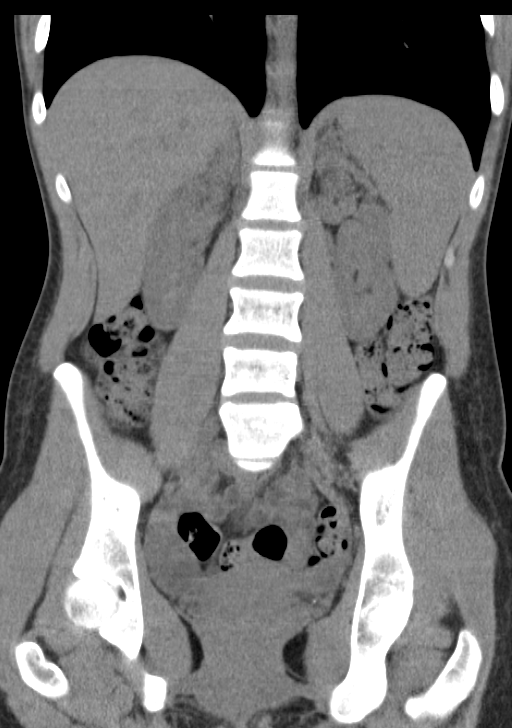

[16 of 46 positions shown; findings below may reference images not displayed]

FINDINGS: Lung bases show triangular-shaped nodular densities in the
subpleural left lower lobe, measuring 4 mm or less in size. These
likely represent subpleural lymph nodes in a patient of this age.
Heart size normal. No pericardial or pleural effusion.

Liver, gallbladder, adrenal glands, kidneys and spleen are
unremarkable. Pancreas appears somewhat fatty replaced. Stomach,
small bowel and colon are unremarkable. Small pelvic free fluid.
Uterus and ovaries are visualized. No pathologically enlarged lymph
nodes. No worrisome lytic or sclerotic lesions.
IMPRESSION: Small pelvic free fluid.  No urinary stones or obstruction.

## 2015-10-15 ENCOUNTER — Encounter (HOSPITAL_BASED_OUTPATIENT_CLINIC_OR_DEPARTMENT_OTHER): Payer: Self-pay | Admitting: *Deleted

## 2015-10-15 ENCOUNTER — Emergency Department (HOSPITAL_BASED_OUTPATIENT_CLINIC_OR_DEPARTMENT_OTHER)
Admission: EM | Admit: 2015-10-15 | Discharge: 2015-10-15 | Disposition: A | Discharge status: Home / Self Care | Payer: Medicaid Other | Attending: Emergency Medicine | Admitting: Emergency Medicine

## 2015-10-15 DIAGNOSIS — M545 Low back pain: Secondary | ICD-10-CM | POA: Diagnosis not present

## 2015-10-15 DIAGNOSIS — F17210 Nicotine dependence, cigarettes, uncomplicated: Secondary | ICD-10-CM | POA: Insufficient documentation

## 2015-10-15 DIAGNOSIS — R112 Nausea with vomiting, unspecified: Secondary | ICD-10-CM | POA: Diagnosis present

## 2015-10-15 DIAGNOSIS — K529 Noninfective gastroenteritis and colitis, unspecified: Secondary | ICD-10-CM | POA: Diagnosis not present

## 2015-10-15 LAB — CBC WITH DIFFERENTIAL/PLATELET
BASOS PCT: 0 %
Basophils Absolute: 0 10*3/uL (ref 0.0–0.1)
Eosinophils Absolute: 0 10*3/uL (ref 0.0–0.7)
Eosinophils Relative: 1 %
HEMATOCRIT: 36.3 % (ref 36.0–46.0)
HEMOGLOBIN: 12.2 g/dL (ref 12.0–15.0)
LYMPHS ABS: 1.1 10*3/uL (ref 0.7–4.0)
Lymphocytes Relative: 29 %
MCH: 31.2 pg (ref 26.0–34.0)
MCHC: 33.6 g/dL (ref 30.0–36.0)
MCV: 92.8 fL (ref 78.0–100.0)
Monocytes Absolute: 0.3 10*3/uL (ref 0.1–1.0)
Monocytes Relative: 9 %
NEUTROS ABS: 2.4 10*3/uL (ref 1.7–7.7)
NEUTROS PCT: 61 %
Platelets: 150 10*3/uL (ref 150–400)
RBC: 3.91 MIL/uL (ref 3.87–5.11)
RDW: 13 % (ref 11.5–15.5)
WBC: 3.8 10*3/uL — ABNORMAL LOW (ref 4.0–10.5)

## 2015-10-15 LAB — COMPREHENSIVE METABOLIC PANEL
ALT: 17 U/L (ref 14–54)
AST: 22 U/L (ref 15–41)
Albumin: 4.2 g/dL (ref 3.5–5.0)
Alkaline Phosphatase: 76 U/L (ref 38–126)
Anion gap: 9 (ref 5–15)
BUN: 16 mg/dL (ref 6–20)
CHLORIDE: 106 mmol/L (ref 101–111)
CO2: 24 mmol/L (ref 22–32)
Calcium: 9 mg/dL (ref 8.9–10.3)
Creatinine, Ser: 0.85 mg/dL (ref 0.44–1.00)
GFR calc Af Amer: >60 mL/min (ref >60)
GFR calc non Af Amer: >60 mL/min (ref >60)
Glucose, Bld: 106 mg/dL — ABNORMAL HIGH (ref 65–99)
POTASSIUM: 4 mmol/L (ref 3.5–5.1)
Sodium: 139 mmol/L (ref 135–145)
Total Bilirubin: 0.5 mg/dL (ref 0.3–1.2)
Total Protein: 7.1 g/dL (ref 6.5–8.1)

## 2015-10-15 LAB — URINALYSIS, ROUTINE W REFLEX MICROSCOPIC
BILIRUBIN URINE: NEGATIVE
Glucose, UA: NEGATIVE mg/dL
HGB URINE DIPSTICK: NEGATIVE
KETONES UR: NEGATIVE mg/dL
NITRITE: NEGATIVE
Protein, ur: NEGATIVE mg/dL
Specific Gravity, Urine: 1.015 (ref 1.005–1.030)
pH: 6.5 (ref 5.0–8.0)

## 2015-10-15 LAB — PREGNANCY, URINE: PREG TEST UR: NEGATIVE

## 2015-10-15 LAB — URINE MICROSCOPIC-ADD ON

## 2015-10-15 MED ORDER — SODIUM CHLORIDE 0.9 % IV BOLUS (SEPSIS)
1000.0000 mL | Freq: Once | INTRAVENOUS | Status: AC
  Administered 2015-10-15: 1000 mL via INTRAVENOUS

## 2015-10-15 MED ORDER — ONDANSETRON HCL 4 MG/2ML IJ SOLN
4.0000 mg | Freq: Once | INTRAMUSCULAR | Status: AC
  Administered 2015-10-15: 4 mg via INTRAVENOUS
  Filled 2015-10-15: qty 2

## 2015-10-15 MED ORDER — ONDANSETRON 8 MG PO TBDP: ORAL_TABLET | ORAL | Status: DC

## 2015-10-15 MED ORDER — KETOROLAC TROMETHAMINE 30 MG/ML IJ SOLN
30.0000 mg | Freq: Once | INTRAMUSCULAR | Status: AC
  Administered 2015-10-15: 30 mg via INTRAVENOUS
  Filled 2015-10-15: qty 1

## 2015-10-15 NOTE — ED Provider Notes (Signed)
CSN: [id number]     Arrival date & time 10/15/15  1420 History   First MD Initiated Contact with Patient 10/15/15 1508     Chief Complaint  Patient presents with  . Emesis     (Consider location/radiation/quality/duration/timing/severity/associated sxs/prior Treatment) HPI Comments: Patient is a 25 year old female who presents with complaints of nausea, vomiting, and diarrhea since yesterday. She reports multiple episodes of each which are nonbloody. She has felt fever and chills. Last urination was this morning. She is also concerned she may have a urinary tract infection as her sides and low back are hurting. Several family members have been ill at home with URI type symptoms.  Patient is a 25 y.o. female presenting with vomiting. The history is provided by the patient.  Emesis Severity:  Moderate Duration:  2 days Timing:  Constant Progression:  Worsening Chronicity:  New Recent urination:  Decreased Relieved by:  Nothing Worsened by:  Nothing tried Ineffective treatments:  None tried Associated symptoms: chills, diarrhea and fever   Associated symptoms: no abdominal pain     History reviewed. No pertinent past medical history. Past Surgical History  Procedure Laterality Date  . Appendectomy     No family history on file. Social History  Substance Use Topics  . Smoking status: Current Every Day Smoker -- 0.50 packs/day    Types: Cigarettes  . Smokeless tobacco: None  . Alcohol Use: Yes     Comment: occ   OB History    No data available     Review of Systems  Constitutional: Positive for chills.  Gastrointestinal: Positive for vomiting and diarrhea. Negative for abdominal pain.  All other systems reviewed and are negative.     Allergies  Review of patient's allergies indicates no known allergies.  Home Medications   Prior to Admission medications   Medication Sig Start Date End Date Taking? Authorizing Provider  cephALEXin (KEFLEX) 500 MG capsule Take 1  capsule (500 mg total) by mouth 4 (four) times daily. 08/08/14   Kathrynn Speedobyn M Hess, PA-C  ondansetron (ZOFRAN ODT) 4 MG disintegrating tablet 4mg  ODT q4 hours prn nausea/vomit 04/29/14   Elwin MochaBlair Walden, MD  phenazopyridine (PYRIDIUM) 200 MG tablet Take 1 tablet (200 mg total) by mouth 3 (three) times daily. 08/08/14   Kathrynn Speedobyn M Hess, PA-C  Prenatal Vit-Fe Fumarate-FA (PRENATAL COMPLETE) 14-0.4 MG TABS Take 14 mg by mouth daily. 10/21/14   Robyn M Hess, PA-C   BP 110/71 mmHg  Pulse 94  Temp(Src) 98 F (36.7 C) (Oral)  Resp 16  SpO2 100% Physical Exam  Constitutional: She is oriented to person, place, and time. She appears well-developed and well-nourished. No distress.  HENT:  Head: Normocephalic and atraumatic.  Neck: Normal range of motion. Neck supple.  Cardiovascular: Normal rate and regular rhythm.  Exam reveals no gallop and no friction rub.   No murmur heard. Pulmonary/Chest: Effort normal and breath sounds normal. No respiratory distress. She has no wheezes.  Abdominal: Soft. Bowel sounds are normal. She exhibits no distension. There is no tenderness.  Musculoskeletal: Normal range of motion.  Neurological: She is alert and oriented to person, place, and time.  Skin: Skin is warm and dry. She is not diaphoretic.  Nursing note and vitals reviewed.   ED Course  Procedures (including critical care time) Labs Review Labs Reviewed  COMPREHENSIVE METABOLIC PANEL  CBC WITH DIFFERENTIAL/PLATELET  URINALYSIS, ROUTINE W REFLEX MICROSCOPIC (NOT AT [facility])  PREGNANCY, URINE    Imaging Review No results found. I have  personally reviewed and evaluated these images and lab results as part of my medical decision-making.    MDM   Final diagnoses:  None    Patient's presentation, exam, and laboratory studies are consistent with a viral gastroenteritis. She is feeling better after fluids and Zofran and will be discharged with Zofran and when necessary return.    Geoffery Lyons, MD 10/15/15  1640

## 2015-10-15 NOTE — Discharge Instructions (Signed)
Zofran as prescribed as needed for nausea.  Clear liquid diet for the next 12 hours, then slowly advance to toast, crackers and a bland diet.  Return to the ER for severe abdominal pain, bloody stool, or other new and concerning symptoms.

## 2015-10-15 NOTE — ED Notes (Signed)
MD at bedside. 

## 2015-10-15 NOTE — ED Notes (Signed)
Fever this am. Nausea and vomiting.

## 2016-01-14 ENCOUNTER — Encounter (HOSPITAL_BASED_OUTPATIENT_CLINIC_OR_DEPARTMENT_OTHER): Payer: Self-pay | Admitting: Emergency Medicine

## 2016-01-14 ENCOUNTER — Emergency Department (HOSPITAL_BASED_OUTPATIENT_CLINIC_OR_DEPARTMENT_OTHER)
Admission: EM | Admit: 2016-01-14 | Discharge: 2016-01-14 | Disposition: A | Discharge status: Home / Self Care | Payer: Medicaid Other | Attending: Emergency Medicine | Admitting: Emergency Medicine

## 2016-01-14 DIAGNOSIS — F17210 Nicotine dependence, cigarettes, uncomplicated: Secondary | ICD-10-CM | POA: Diagnosis not present

## 2016-01-14 DIAGNOSIS — Z202 Contact with and (suspected) exposure to infections with a predominantly sexual mode of transmission: Secondary | ICD-10-CM

## 2016-01-14 LAB — PREGNANCY, URINE: Preg Test, Ur: NEGATIVE

## 2016-01-14 LAB — URINALYSIS, ROUTINE W REFLEX MICROSCOPIC
Bilirubin Urine: NEGATIVE
Glucose, UA: NEGATIVE mg/dL
Hgb urine dipstick: NEGATIVE
Ketones, ur: NEGATIVE mg/dL
Leukocytes, UA: NEGATIVE
Nitrite: NEGATIVE
Protein, ur: NEGATIVE mg/dL
Specific Gravity, Urine: 1.025 (ref 1.005–1.030)
pH: 6 (ref 5.0–8.0)

## 2016-01-14 LAB — WET PREP, GENITAL
Clue Cells Wet Prep HPF POC: NONE SEEN
Sperm: NONE SEEN
Trich, Wet Prep: NONE SEEN
Yeast Wet Prep HPF POC: NONE SEEN

## 2016-01-14 MED ORDER — CEFTRIAXONE SODIUM 250 MG IJ SOLR
250.0000 mg | Freq: Once | INTRAMUSCULAR | Status: AC
  Administered 2016-01-14: 250 mg via INTRAMUSCULAR
  Filled 2016-01-14: qty 250

## 2016-01-14 MED ORDER — AZITHROMYCIN 250 MG PO TABS
1000.0000 mg | ORAL_TABLET | Freq: Once | ORAL | Status: AC
  Administered 2016-01-14: 1000 mg via ORAL
  Filled 2016-01-14: qty 4

## 2016-01-14 NOTE — Discharge Instructions (Signed)
Sexually Transmitted Disease A sexually transmitted disease (STD) is a disease or infection often passed to another person during sex. However, STDs can be passed through nonsexual ways. An STD can be passed through:  Spit (saliva).  Semen.  Blood.  Mucus from the vagina.  Pee (urine). HOW CAN I LESSEN MY CHANCES OF GETTING AN STD?  Use:  Latex condoms.  Water-soluble lubricants with condoms. Do not use petroleum jelly or oils.  Dental dams. These are small pieces of latex that are used as a barrier during oral sex.  Avoid having more than one sex partner.  Do not have sex with someone who has other sex partners.  Do not have sex with anyone you do not know or who is at high risk for an STD.  Avoid risky sex that can break your skin.  Do not have sex if you have open sores on your mouth or skin.  Avoid drinking too much alcohol or taking illegal drugs. Alcohol and drugs can affect your good judgment.  Avoid oral and anal sex acts.  Get shots (vaccines) for HPV and hepatitis.  If you are at risk of being infected with HIV, it is advised that you take a certain medicine daily to prevent HIV infection. This is called pre-exposure prophylaxis (PrEP). You may be at risk if:  You are a man who has sex with other men (MSM).  You are attracted to the opposite sex (heterosexual) and are having sex with more than one partner.  You take drugs with a needle.  You have sex with someone who has HIV.  Talk with your doctor about if you are at high risk of being infected with HIV. If you begin to take PrEP, get tested for HIV first. Get tested every 3 months for as long as you are taking PrEP.  Get tested for STDs every year if you are sexually active. If you are treated for an STD, get tested again 3 months after you are treated. WHAT SHOULD I DO IF I THINK I HAVE AN STD?  See your doctor.  Tell your sex partner(s) that you have an STD. They should be tested and treated.  Do  not have sex until your doctor says it is okay. WHEN SHOULD I GET HELP? Get help right away if:  You have bad belly (abdominal) pain.  You are a man and have puffiness (swelling) or pain in your testicles.  You are a woman and have puffiness in your vagina.   This information is not intended to replace advice given to you by your health care provider. Make sure you discuss any questions you have with your health care provider.  Your gonorrhea, chlamydia, syphilis and HIV results will come back in the next 2-3 days. You'll be contacted if they're positive. Follow-up with your primary care doctor if you experience any new symptoms. Return to the ED if you expands abdominal pain, vaginal discharge and burning with urination, fevers or chills.

## 2016-01-14 NOTE — ED Provider Notes (Signed)
CSN: [id number]     Arrival date & time 01/14/16  1019 History   First MD Initiated Contact with Patient 01/14/16 1033     Chief Complaint  Patient presents with  . Exposure to STD     (Consider location/radiation/quality/duration/timing/severity/associated sxs/prior Treatment) HPI   Sydney Carpenter is a 25 year old female with no significant past medical history who presents to the ED today for possible STD exposure. Patient states that her baby daddy called her today and told her that he went to the hospital and was diagnosed with genital herpes. Patient states that she does not have any active symptoms at this time but is concerned that she may have been exposed wants to be examined for this. She states her last sexual encounter with this person was 2-3 weeks ago. She was not aware or do not see any active breakouts on him at that time. She denies any vaginal discharge, dysuria, abdominal pain. Patient uses the Nexplanon for contraception. She states she is currently menstruating but it is very light.  History reviewed. No pertinent past medical history. Past Surgical History  Procedure Laterality Date  . Appendectomy     History reviewed. No pertinent family history. Social History  Substance Use Topics  . Smoking status: Current Every Day Smoker -- 0.50 packs/day    Types: Cigarettes  . Smokeless tobacco: None  . Alcohol Use: Yes     Comment: occ   OB History    No data available     Review of Systems  All other systems reviewed and are negative.     Allergies  Review of patient's allergies indicates no known allergies.  Home Medications   Prior to Admission medications   Not on File   BP 117/72 mmHg  Pulse 89  Temp(Src) 98.7 F (37.1 C) (Oral)  Resp 16  Ht 5\' 5"  (1.651 m)  Wt 68.04 kg  BMI 24.96 kg/m2  SpO2 98% Physical Exam  Constitutional: She is oriented to person, place, and time. She appears well-developed and well-nourished. No distress.  HENT:   Head: Normocephalic and atraumatic.  Eyes: Conjunctivae are normal. Right eye exhibits no discharge. Left eye exhibits no discharge. No scleral icterus.  Cardiovascular: Normal rate.   Pulmonary/Chest: Effort normal.  Abdominal: Soft. Bowel sounds are normal. She exhibits no distension and no mass. There is no tenderness. There is no rebound and no guarding.  Genitourinary:  No external genital lesions. No CMT. Mild bleeding around cervix, patient is currently menstruating. No adnexal tenderness. No friability of cervix.  Neurological: She is alert and oriented to person, place, and time. Coordination normal.  Skin: Skin is warm and dry. No rash noted. She is not diaphoretic. No erythema. No pallor.  Psychiatric: She has a normal mood and affect. Her behavior is normal.  Nursing note and vitals reviewed.   ED Course  Procedures (including critical care time) Labs Review Labs Reviewed  WET PREP, GENITAL  URINE CULTURE  RPR  HIV ANTIBODY (ROUTINE TESTING)  URINALYSIS, ROUTINE W REFLEX MICROSCOPIC (NOT AT [facility])  PREGNANCY, URINE  GC/CHLAMYDIA PROBE AMP ([place]) NOT AT [facility]    Imaging Review No results found. I have personally reviewed and evaluated these images and lab results as part of my medical decision-making.   EKG Interpretation None      MDM   Final diagnoses:  STD exposure    Patient present to the ED with concern for possible exposure to genital herpes. She does not currently have any  symptoms at this time. No external lesions or ulcerations seen on exam. She denies any dysuria, vaginal discharge. She is currently menstruating. She was treated prophylactically in the ED with azithromycin and Rocephin. Gonorrhea, chlamydia, HIV and RPR was sent. Pregnancy test is negative. UA negative for infection. There were increased and PVCs seen on wet prep. But no yeast, BV or Trichomonas. Patient will follow-up with OB/GYN for further evaluation. She'll be contacted if any  of her results were positive. I discussed the treatment plan with the patient who expresses understanding. Return precautions outlined in patient discharge instructions.    Lester KinsmanSamantha Tripp Belhaven[place]owless, PA-C 01/14/16 1441  Lavera Guiseana Duo Liu, MD 01/14/16 [phone]

## 2016-01-14 NOTE — ED Notes (Signed)
Pt states her baby daddy called and said he was exposed by her for an STD and pt doesn't thinks it's her but wants to make sure.

## 2016-01-15 LAB — URINE CULTURE: Culture: NO GROWTH

## 2016-01-15 LAB — HIV ANTIBODY (ROUTINE TESTING W REFLEX): HIV Screen 4th Generation wRfx: NONREACTIVE

## 2016-01-15 LAB — SYPHILIS: RPR W/REFLEX TO RPR TITER AND TREPONEMAL ANTIBODIES, TRADITIONAL SCREENING AND DIAGNOSIS ALGORITHM: RPR Ser Ql: NONREACTIVE

## 2016-01-15 LAB — GC/CHLAMYDIA PROBE AMP (~~LOC~~) NOT AT ARMC
Chlamydia: POSITIVE — AB
Neisseria Gonorrhea: POSITIVE — AB

## 2016-01-18 ENCOUNTER — Telehealth (HOSPITAL_BASED_OUTPATIENT_CLINIC_OR_DEPARTMENT_OTHER): Payer: Self-pay | Admitting: Emergency Medicine

## 2016-03-06 ENCOUNTER — Encounter (HOSPITAL_BASED_OUTPATIENT_CLINIC_OR_DEPARTMENT_OTHER): Payer: Self-pay | Admitting: Emergency Medicine

## 2016-03-06 ENCOUNTER — Emergency Department (HOSPITAL_BASED_OUTPATIENT_CLINIC_OR_DEPARTMENT_OTHER)
Admission: EM | Admit: 2016-03-06 | Discharge: 2016-03-07 | Disposition: A | Discharge status: Home / Self Care | Payer: Medicaid Other | Attending: Emergency Medicine | Admitting: Emergency Medicine

## 2016-03-06 DIAGNOSIS — N201 Calculus of ureter: Secondary | ICD-10-CM | POA: Diagnosis not present

## 2016-03-06 DIAGNOSIS — F17210 Nicotine dependence, cigarettes, uncomplicated: Secondary | ICD-10-CM | POA: Diagnosis not present

## 2016-03-06 DIAGNOSIS — N898 Other specified noninflammatory disorders of vagina: Secondary | ICD-10-CM

## 2016-03-06 DIAGNOSIS — R109 Unspecified abdominal pain: Secondary | ICD-10-CM | POA: Diagnosis present

## 2016-03-06 LAB — URINALYSIS, ROUTINE W REFLEX MICROSCOPIC
GLUCOSE, UA: NEGATIVE mg/dL
HGB URINE DIPSTICK: NEGATIVE
Ketones, ur: 15 mg/dL — AB
Leukocytes, UA: NEGATIVE
Nitrite: NEGATIVE
Protein, ur: NEGATIVE mg/dL
SPECIFIC GRAVITY, URINE: 1.035 — AB (ref 1.005–1.030)
pH: 6 (ref 5.0–8.0)

## 2016-03-06 LAB — PREGNANCY, URINE: PREG TEST UR: NEGATIVE

## 2016-03-06 NOTE — ED Triage Notes (Signed)
Patient c/o right flank pain, with frequency and urgency to void. Patient also requesting STD check, was exposed to gonorrhea, denies any symptoms.

## 2016-03-06 NOTE — ED Notes (Signed)
Pt describes right flank pain x 3 days. Hx UTI and "feels the same". Denies other urinary s/s or vag d/c. Also states she has been exposed to gonorrhea and needs to be checked for same.

## 2016-03-06 NOTE — ED Provider Notes (Signed)
[facility] Provider Note   CSN: [id number] Arrival date & time: 03/06/16  2241 By signing my name below, I, Sydney Carpenter, attest that this documentation has been prepared under the direction and in the presence of [employer], PA-C. Electronically Signed: Bridgette Carpenter, ED Scribe. 03/07/16. 12:04 AM.  History   Chief Complaint Chief Complaint  Patient presents with  . Flank Pain    right  . STD check   HPI Comments: Sydney Carpenter is a 25 y.o. female who presents to the Emergency Department complaining of sudden onset, constant, cramping, 3/10 right flank pain onset 3 days ago. Pt also has associated urinary frequency and urinary urgency. She also notes that her urine has been darker than usual. Pt has h/o UTIs and states that her symptoms at this time feel similar to when she had a UTI. Pt is also requesting an STD check, notes that she was exposed to gonorrhea. Pt is on birth control. Pt has been 2 sexual partners in the past 6 months. Denies vaginal discharge, dysuria, or any other associated symptoms.  [id number]   The history is provided by the patient. No language interpreter was used.   History reviewed. No pertinent past medical history.  There are no active problems to display for this patient.   Past Surgical History:  Procedure Laterality Date  . APPENDECTOMY      OB History    No data available       Home Medications    Prior to Admission medications   Not on File    Family History History reviewed. No pertinent family history.  Social History Social History  Substance Use Topics  . Smoking status: Current Every Day Smoker    Packs/day: 0.50    Types: Cigarettes  . Smokeless tobacco: Never Used  . Alcohol use Yes     Comment: occ     Allergies   Review of patient's allergies indicates no known allergies.   Review of Systems Review of Systems All other systems negative except as documented in the HPI. All pertinent positives and  negatives as reviewed in the HPI. Physical Exam Updated Vital Signs BP 118/72 (BP Location: Left Arm)   Pulse 97   Temp 98.2 F (36.8 C) (Oral)   Resp 18   Ht 5\' 5"  (1.651 m)   Wt 153 lb (69.4 kg)   SpO2 100%   BMI 25.46 kg/m   Physical Exam  Constitutional: She appears well-developed and well-nourished.  HENT:  Head: Normocephalic.  Eyes: Conjunctivae are normal.  Cardiovascular: Normal rate, regular rhythm and normal heart sounds.  Exam reveals no gallop and no friction rub.   No murmur heard. Pulmonary/Chest: Effort normal and breath sounds normal. No respiratory distress. She has no wheezes. She has no rales.  Abdominal: Soft. Bowel sounds are normal. She exhibits no distension. There is tenderness. There is no guarding.  RLQ tenderness  Genitourinary:  Genitourinary Comments: Chaperone present throughout entire exam. Right CVA tenderness.  Musculoskeletal: Normal range of motion.  Neurological: She is alert.  Skin: Skin is warm and dry.  Psychiatric: She has a normal mood and affect. Her behavior is normal.  Nursing note and vitals reviewed.    ED Treatments / Results  DIAGNOSTIC STUDIES: Oxygen Saturation is 100% on RA, normal by my interpretation.    COORDINATION OF CARE: 12:03 AM Discussed treatment plan with pt at bedside which includes [place] and pelvic exam and pt agreed to plan.  Labs (all labs ordered  are listed, but only abnormal results are displayed) Labs Reviewed  URINALYSIS, ROUTINE W REFLEX MICROSCOPIC (NOT AT [facility]) - Abnormal; Notable for the following:       Result Value   Color, Urine AMBER (*)    Specific Gravity, Urine 1.035 (*)    Bilirubin Urine SMALL (*)    Ketones, ur 15 (*)    All other components within normal limits  PREGNANCY, URINE    EKG  EKG Interpretation None       Radiology No results found.  Procedures Procedures (including critical care time)  Medications Ordered in ED Medications - No data to display   Initial  Impression / Assessment and Plan / ED Course  I have reviewed the triage vital signs and the nursing notes.  Pertinent labs & imaging results that were available during my care of the patient were reviewed by me and considered in my medical decision making (see chart for details).  Clinical Course    Patient be treated for STD along with the 4 mm kidney stone.  The patient is advised return here as needed.  She was also given referral to urology  Final Clinical Impressions(s) / ED Diagnoses   Final diagnoses:  None    New Prescriptions New Prescriptions   No medications on file     Charlestine NightChristopher Haliegh Khurana, PA-C 03/08/16 0126    Laurence Spatesachel Morgan Little, MD 03/08/16 [phone]

## 2016-03-06 NOTE — ED Notes (Signed)
Pelvic cart is outside the patients room.

## 2016-03-07 ENCOUNTER — Emergency Department (HOSPITAL_BASED_OUTPATIENT_CLINIC_OR_DEPARTMENT_OTHER): Payer: Medicaid Other

## 2016-03-07 LAB — CBC WITH DIFFERENTIAL/PLATELET
Basophils Absolute: 0 10*3/uL (ref 0.0–0.1)
Basophils Relative: 0 %
Eosinophils Absolute: 0.1 10*3/uL (ref 0.0–0.7)
Eosinophils Relative: 1 %
HCT: 35.4 % — ABNORMAL LOW (ref 36.0–46.0)
Hemoglobin: 12 g/dL (ref 12.0–15.0)
Lymphocytes Relative: 40 %
Lymphs Abs: 2.8 10*3/uL (ref 0.7–4.0)
MCH: 31.8 pg (ref 26.0–34.0)
MCHC: 33.9 g/dL (ref 30.0–36.0)
MCV: 93.9 fL (ref 78.0–100.0)
Monocytes Absolute: 0.6 10*3/uL (ref 0.1–1.0)
Monocytes Relative: 8 %
Neutro Abs: 3.6 10*3/uL (ref 1.7–7.7)
Neutrophils Relative %: 51 %
Platelets: 187 10*3/uL (ref 150–400)
RBC: 3.77 MIL/uL — ABNORMAL LOW (ref 3.87–5.11)
RDW: 13 % (ref 11.5–15.5)
WBC: 7.1 10*3/uL (ref 4.0–10.5)

## 2016-03-07 LAB — BASIC METABOLIC PANEL
Anion gap: 6 (ref 5–15)
BUN: 21 mg/dL — ABNORMAL HIGH (ref 6–20)
CO2: 26 mmol/L (ref 22–32)
Calcium: 9.2 mg/dL (ref 8.9–10.3)
Chloride: 107 mmol/L (ref 101–111)
Creatinine, Ser: 0.88 mg/dL (ref 0.44–1.00)
GFR calc Af Amer: >60 mL/min (ref >60)
GFR calc non Af Amer: >60 mL/min (ref >60)
Glucose, Bld: 94 mg/dL (ref 65–99)
Potassium: 3.4 mmol/L — ABNORMAL LOW (ref 3.5–5.1)
Sodium: 139 mmol/L (ref 135–145)

## 2016-03-07 LAB — WET PREP, GENITAL
Sperm: NONE SEEN
Yeast Wet Prep HPF POC: NONE SEEN

## 2016-03-07 MED ORDER — METRONIDAZOLE 500 MG PO TABS
2000.0000 mg | ORAL_TABLET | Freq: Once | ORAL | Status: AC
  Administered 2016-03-07: 2000 mg via ORAL
  Filled 2016-03-07: qty 4

## 2016-03-07 MED ORDER — CEFTRIAXONE SODIUM 250 MG IJ SOLR
250.0000 mg | Freq: Once | INTRAMUSCULAR | Status: AC
  Administered 2016-03-07: 250 mg via INTRAMUSCULAR
  Filled 2016-03-07: qty 250

## 2016-03-07 MED ORDER — LIDOCAINE HCL (PF) 1 % IJ SOLN
INTRAMUSCULAR | Status: AC
  Administered 2016-03-07: 0.9 mL
  Filled 2016-03-07: qty 5

## 2016-03-07 MED ORDER — SODIUM CHLORIDE 0.9 % IV BOLUS (SEPSIS)
1000.0000 mL | Freq: Once | INTRAVENOUS | Status: AC
  Administered 2016-03-07: 1000 mL via INTRAVENOUS

## 2016-03-07 MED ORDER — AZITHROMYCIN 250 MG PO TABS
1000.0000 mg | ORAL_TABLET | Freq: Once | ORAL | Status: AC
  Administered 2016-03-07: 1000 mg via ORAL
  Filled 2016-03-07: qty 4

## 2016-03-07 MED ORDER — OXYCODONE-ACETAMINOPHEN 5-325 MG PO TABS: 1.0000 | ORAL_TABLET | Freq: Four times a day (QID) | ORAL | 0 refills | Status: AC | PRN

## 2016-03-07 NOTE — ED Notes (Signed)
Pt given d/c instructions as per chart. Verbalizes understanding. No questions. Rx x 1 with narc/tyl prec.

## 2016-03-08 LAB — GC/CHLAMYDIA PROBE AMP (~~LOC~~) NOT AT ARMC
Chlamydia: POSITIVE — AB
Neisseria Gonorrhea: POSITIVE — AB

## 2016-03-09 ENCOUNTER — Telehealth (HOSPITAL_COMMUNITY): Payer: Self-pay

## 2016-03-09 NOTE — Telephone Encounter (Signed)
Results received from [facility].  Treated with Zithromax and Rocephin.  DHHS form completed and faxed.     03/09/2016 @ 9:05  LVM requesting callback.  [facility]

## 2016-04-28 ENCOUNTER — Telehealth (HOSPITAL_COMMUNITY): Payer: Self-pay

## 2016-04-28 NOTE — Telephone Encounter (Signed)
Letter returned.  Chart appended.

## 2016-05-03 ENCOUNTER — Telehealth (HOSPITAL_BASED_OUTPATIENT_CLINIC_OR_DEPARTMENT_OTHER): Payer: Self-pay | Admitting: Emergency Medicine

## 2016-05-03 NOTE — Telephone Encounter (Signed)
Lost to followup 

## 2018-05-10 IMAGING — CT CT RENAL STONE PROTOCOL
2 of 4 series · 17 of 46 positions shown, 19 images · non-contrast
Comparison: 04/24/2013

CLINICAL DATA: Right flank pain.  Bladder irritation symptoms.

EXAM:
CT ABDOMEN AND PELVIS WITHOUT CONTRAST
TECHNIQUE: Multidetector CT imaging of the abdomen and pelvis was performed
following the standard protocol without IV contrast.

[Series 2: axial st · axial · 0.68mm/px · z∈[+346,+712]mm · 14 of 81 slices shown, 16 images]
[im 4/81  soft-tissue]
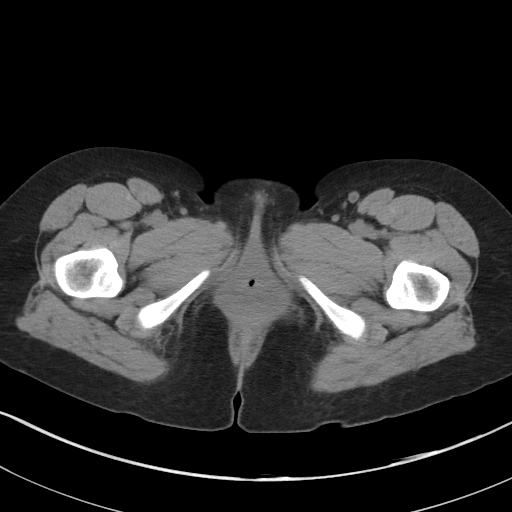
[im 4/81  bone]
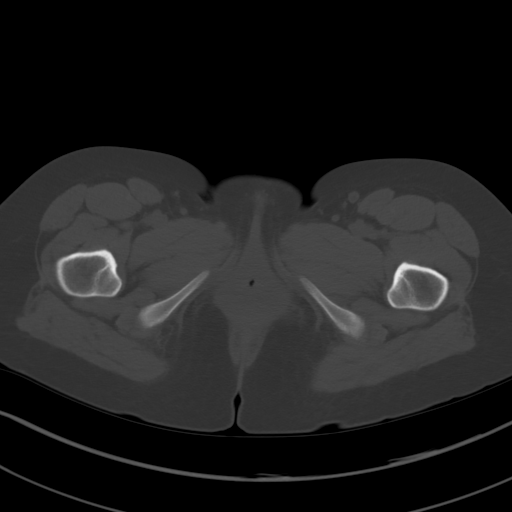
[im 11/81  soft-tissue]
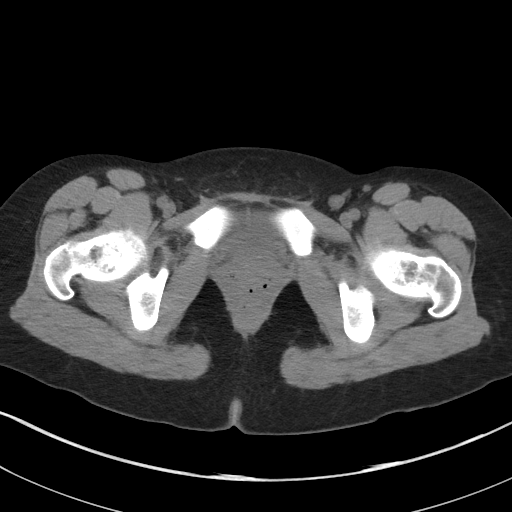
[im 17/81  soft-tissue]
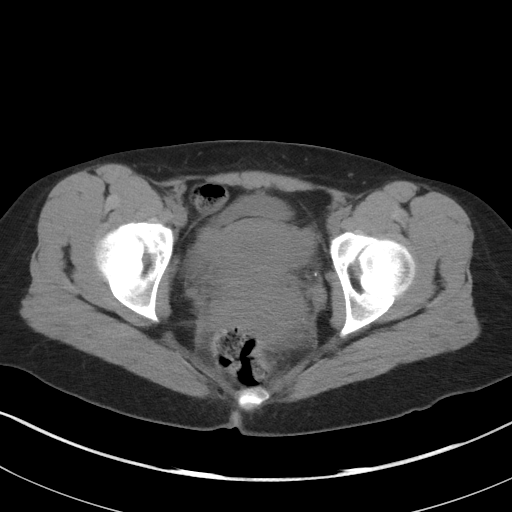
[im 21/81  soft-tissue]
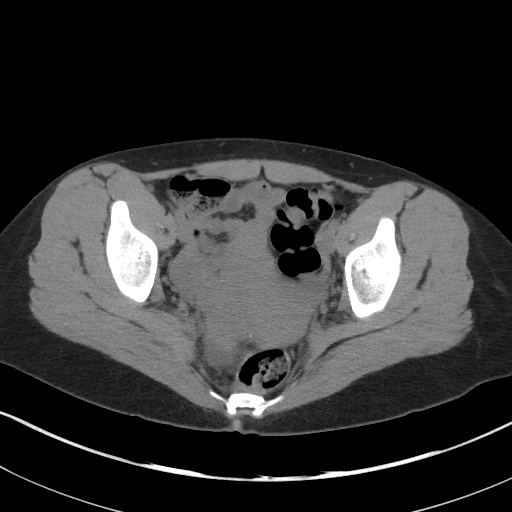
[im 27/81  soft-tissue]
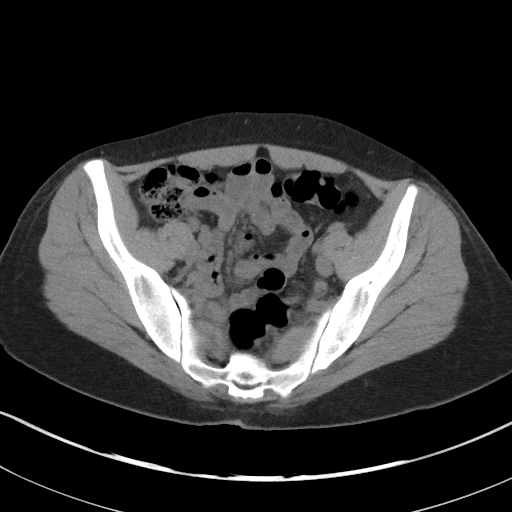
[im 34/81  soft-tissue]
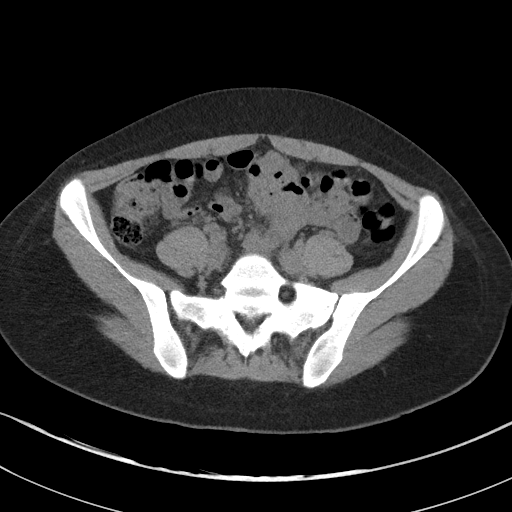
[im 37/81  soft-tissue]
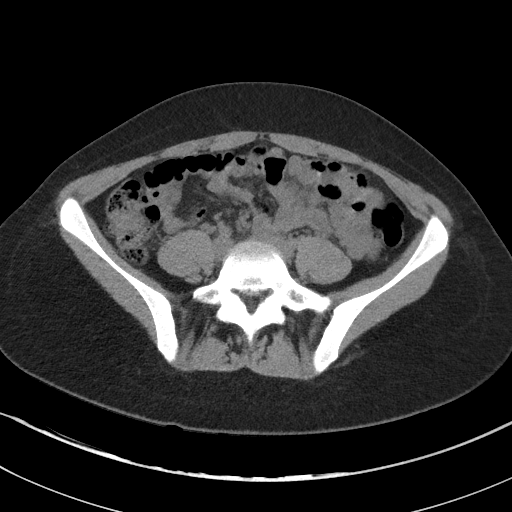
[im 44/81  soft-tissue]
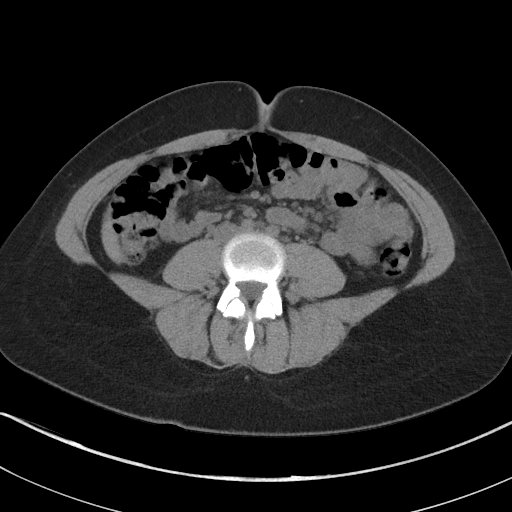
[im 47/81  soft-tissue]
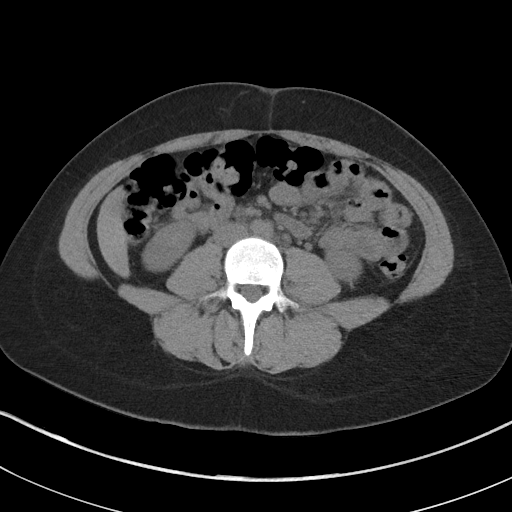
[im 47/81  bone]
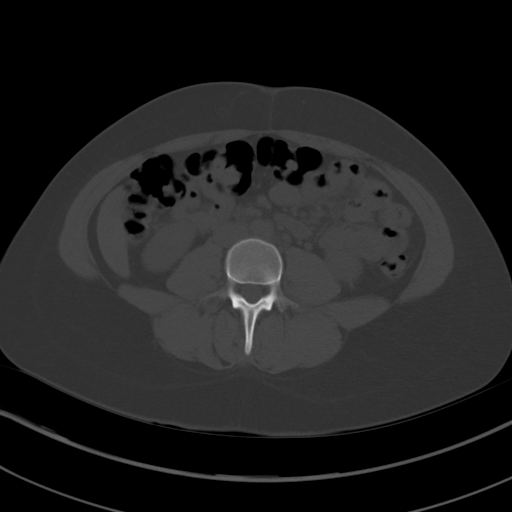
[im 54/81  soft-tissue]
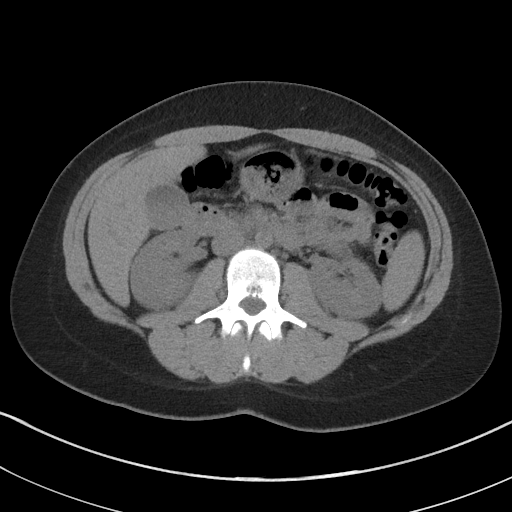
[im 61/81  soft-tissue]
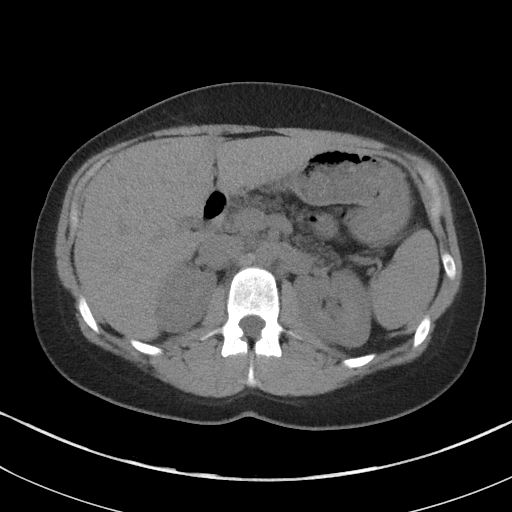
[im 64/81  soft-tissue]
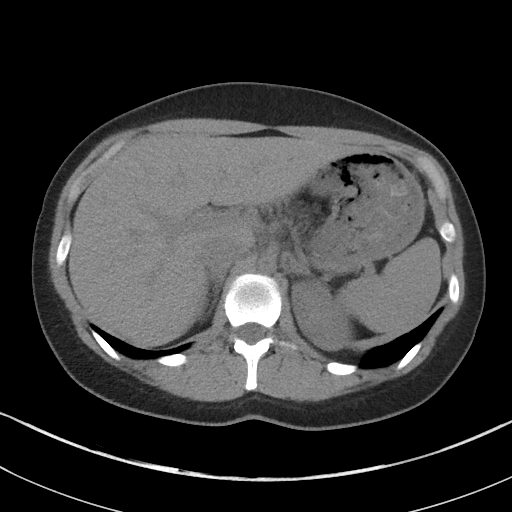
[im 71/81  soft-tissue]
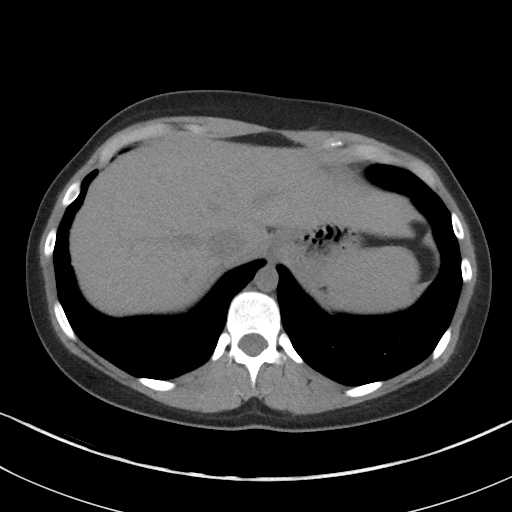
[im 77/81  soft-tissue]
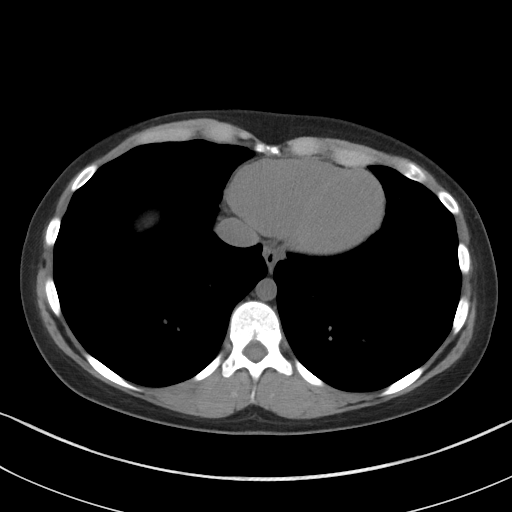

[Series 4: coronal st · coronal · 0.71mm/px · 3 of 101 slices shown]
[im 34/101  soft-tissue]
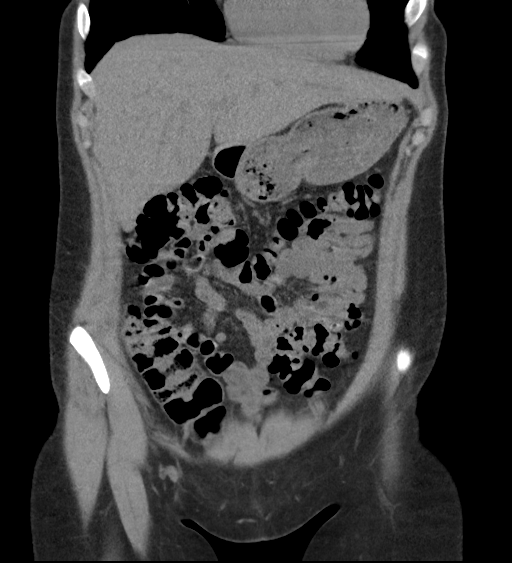
[im 45/101  soft-tissue]
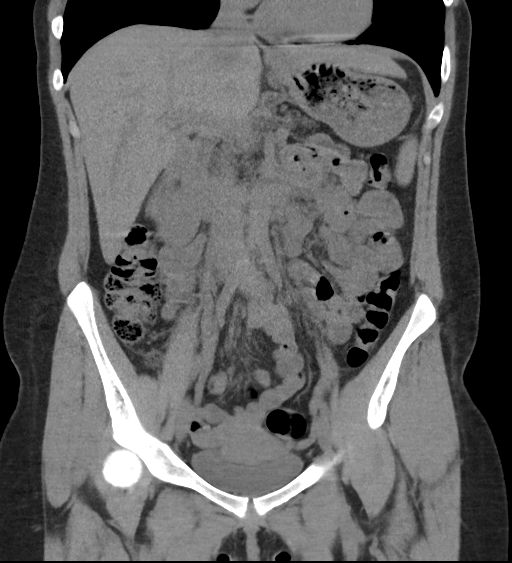
[im 56/101  soft-tissue]
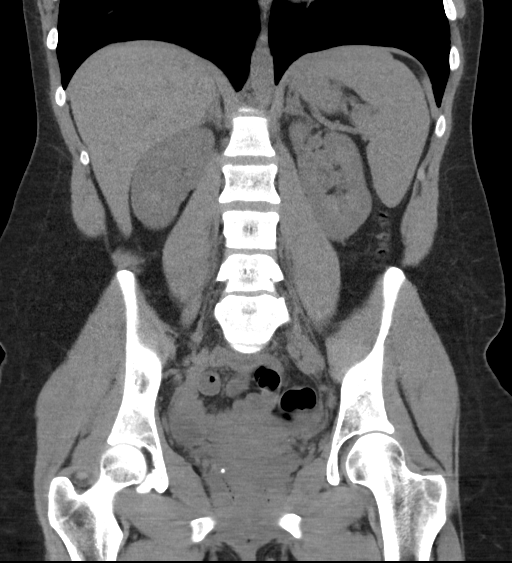

[17 of 46 positions shown; findings below may reference images not displayed]

FINDINGS: There is mild hydronephrosis and ureteral dilatation on the right. A
4 mm calcification at the expected location of the right UVJ
probably represents an obstructing calculus although this is not
conclusively established due to a relative paucity of fat along the
course of the ureter. There is a nonobstructing 3.5 mm calculus of
the upper pole right collecting system. No other urinary calculi are
evident.

There are unremarkable unenhanced appearances of the liver,
gallbladder, bile ducts, spleen, pancreas and adrenals. Bowel is
unremarkable. Prior appendectomy.

Uterus and adnexal structures are unremarkable.

No significant ascites.

No significant abnormality in the lower chest. No significant
skeletal lesion.
IMPRESSION: Probable 4 mm right UVJ calculus. Mild hydronephrosis. Upper pole
right nephrolithiasis.
